# Patient Record
Sex: Female | Born: 1990 | Hispanic: Yes | Marital: Single | Smoking: Former smoker
Health system: Southern US, Community
[De-identification: ages and names within clinical notes are randomized; demographics above are authoritative.]

## PROBLEM LIST (undated history)

## (undated) ENCOUNTER — Inpatient Hospital Stay (HOSPITAL_COMMUNITY): Payer: Self-pay

## (undated) DIAGNOSIS — D649 Anemia, unspecified: Secondary | ICD-10-CM

## (undated) DIAGNOSIS — O139 Gestational [pregnancy-induced] hypertension without significant proteinuria, unspecified trimester: Secondary | ICD-10-CM

## (undated) DIAGNOSIS — R011 Cardiac murmur, unspecified: Secondary | ICD-10-CM

## (undated) HISTORY — PX: TENDON REPAIR: SHX5111

## (undated) HISTORY — PX: NO PAST SURGERIES: SHX2092

---
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
## 2012-12-06 NOTE — L&D Delivery Note (Signed)
Delivery Note At 4:36 PM a viable and healthy female was delivered via  (Presentation: LOA? ).  APGAR: pending ; weight pending.   Placenta status: intact, spontaneous.  Cord: 3 vessel with the following complications: none  Cord pH: n/a  Anesthesia:  local Episiotomy: n/a Lacerations: several second degree vaginal lacerations Suture Repair: 3-0 & 4-0 vicryl Est. Blood Loss (mL): 450cc  Mom to postpartum.  Baby to nursery-stable.  Patient progressed to complete dilation and after a brief second stage of labor, delivered a healthy female with a good spontaneous cry. Infant pinked up quickly. Repair of laceration was performed by Dr. Ike Bene.   Levert Feinstein 08/05/2013, 5:28 PM

## 2012-12-06 NOTE — L&D Delivery Note (Signed)
I was present for the entire delivery and aided with repair. Tawana Scale, MD OB Fellow 08/06/2013 7:50 AM

## 2013-01-05 ENCOUNTER — Emergency Department (HOSPITAL_COMMUNITY)
Admission: EM | Admit: 2013-01-05 | Discharge: 2013-01-05 | Disposition: A | Discharge status: Home / Self Care | Payer: Self-pay | Attending: Emergency Medicine | Admitting: Emergency Medicine

## 2013-01-05 ENCOUNTER — Encounter (HOSPITAL_COMMUNITY): Payer: Self-pay | Admitting: *Deleted

## 2013-01-05 DIAGNOSIS — B349 Viral infection, unspecified: Secondary | ICD-10-CM

## 2013-01-05 DIAGNOSIS — M545 Low back pain, unspecified: Secondary | ICD-10-CM | POA: Insufficient documentation

## 2013-01-05 DIAGNOSIS — O98519 Other viral diseases complicating pregnancy, unspecified trimester: Secondary | ICD-10-CM | POA: Insufficient documentation

## 2013-01-05 DIAGNOSIS — R112 Nausea with vomiting, unspecified: Secondary | ICD-10-CM

## 2013-01-05 DIAGNOSIS — B9789 Other viral agents as the cause of diseases classified elsewhere: Secondary | ICD-10-CM | POA: Insufficient documentation

## 2013-01-05 DIAGNOSIS — O9989 Other specified diseases and conditions complicating pregnancy, childbirth and the puerperium: Secondary | ICD-10-CM | POA: Insufficient documentation

## 2013-01-05 DIAGNOSIS — R197 Diarrhea, unspecified: Secondary | ICD-10-CM | POA: Insufficient documentation

## 2013-01-05 LAB — COMPREHENSIVE METABOLIC PANEL
ALT: 34 U/L (ref 0–35)
Albumin: 4.3 g/dL (ref 3.5–5.2)
Alkaline Phosphatase: 76 U/L (ref 39–117)
Calcium: 9.9 mg/dL (ref 8.4–10.5)
Potassium: 3.8 mEq/L (ref 3.5–5.1)
Sodium: 136 mEq/L (ref 135–145)
Total Protein: 8.2 g/dL (ref 6.0–8.3)

## 2013-01-05 LAB — URINALYSIS, ROUTINE W REFLEX MICROSCOPIC
Glucose, UA: NEGATIVE mg/dL
Leukocytes, UA: NEGATIVE
Nitrite: NEGATIVE
Protein, ur: 30 mg/dL — AB
Urobilinogen, UA: 0.2 mg/dL (ref 0.0–1.0)

## 2013-01-05 LAB — CBC WITH DIFFERENTIAL/PLATELET
Basophils Absolute: 0 10*3/uL (ref 0.0–0.1)
Eosinophils Absolute: 0 10*3/uL (ref 0.0–0.7)
Hemoglobin: 11.5 g/dL — ABNORMAL LOW (ref 12.0–15.0)
Lymphocytes Relative: 5 % — ABNORMAL LOW (ref 12–46)
MCHC: 32.4 g/dL (ref 30.0–36.0)
Neutrophils Relative %: 92 % — ABNORMAL HIGH (ref 43–77)
Platelets: 354 10*3/uL (ref 150–400)
RDW: 20.2 % — ABNORMAL HIGH (ref 11.5–15.5)

## 2013-01-05 LAB — URINE MICROSCOPIC-ADD ON

## 2013-01-05 LAB — PREGNANCY, URINE: Preg Test, Ur: POSITIVE — AB

## 2013-01-05 MED ORDER — ONDANSETRON HCL 4 MG/2ML IJ SOLN
4.0000 mg | Freq: Once | INTRAMUSCULAR | Status: AC
  Administered 2013-01-05: 4 mg via INTRAVENOUS
  Filled 2013-01-05: qty 2

## 2013-01-05 MED ORDER — ONDANSETRON 8 MG PO TBDP: ORAL_TABLET | ORAL | Status: DC

## 2013-01-05 MED ORDER — SODIUM CHLORIDE 0.9 % IV BOLUS (SEPSIS)
1000.0000 mL | Freq: Once | INTRAVENOUS | Status: AC
  Administered 2013-01-05: 1000 mL via INTRAVENOUS

## 2013-01-05 MED ORDER — ACETAMINOPHEN 325 MG PO TABS
650.0000 mg | ORAL_TABLET | Freq: Once | ORAL | Status: AC
  Administered 2013-01-05: 650 mg via ORAL
  Filled 2013-01-05: qty 2

## 2013-01-05 NOTE — ED Notes (Signed)
PA at bedside.

## 2013-01-05 NOTE — ED Notes (Signed)
The pt is c/o of lower back pain with nausea vomiting for 2 days.  lmp nov 27th.  She is 8 weeks preg

## 2013-01-05 NOTE — ED Provider Notes (Signed)
History     CSN: [id number]  Arrival date & time 01/05/13  1929   First MD Initiated Contact with Patient 01/05/13 2031      Chief Complaint  Patient presents with  . Back Pain   HPI  History provided by the patient and family. Patient is a 22 year old female currently about [redacted] weeks pregnant who presents with symptoms of nausea, vomiting, diarrhea and low back ache. Symptoms began 2 days ago with some intermittent nausea vomiting. Today patient reports having more than 5 episodes of watery loose diarrhea stool without blood or mucus. She also developed some shaking in her lower back. She denies any aggravating or alleviating factors. She has not used any treatments. She denies any associated fever, chills or sweats. Denies any urinary changes or complaints. Denies any vaginal bleeding or discharge. Patient's partner was also recently feeling ill with diarrhea symptoms lasted one day. The patient's mother is also began to feel symptoms of vomiting. She denies any other known sick contacts. Denies any recent travel. Patient is an OB/GYN provider.    History reviewed. No pertinent past medical history.  History reviewed. No pertinent past surgical history.  No family history on file.  History  Substance Use Topics  . Smoking status: Never Smoker   . Smokeless tobacco: Not on file  . Alcohol Use: No    OB History    Grav Para Term Preterm Abortions TAB SAB Ect Mult Living                  Review of Systems  Constitutional: Positive for appetite change. Negative for fever and chills.  Respiratory: Negative for shortness of breath.   Cardiovascular: Negative for chest pain.  Gastrointestinal: Positive for nausea, vomiting and diarrhea. Negative for abdominal pain, constipation, blood in stool and rectal pain.  Genitourinary: Negative for dysuria, frequency, hematuria, flank pain, vaginal bleeding, vaginal discharge, vaginal pain and pelvic pain.  Musculoskeletal: Positive for  back pain.  All other systems reviewed and are negative.    Allergies  Review of patient's allergies indicates no known allergies.  Home Medications   Current Outpatient Rx  Name  Route  Sig  Dispense  Refill  . PRENATAL PLUS 27-1 MG PO TABS   Oral   Take 1 tablet by mouth daily.           BP 124/80  Pulse 98  Temp 98.4 F (36.9 C)  Resp 16  SpO2 100%  LMP 11/01/2012  Physical Exam  Nursing note and vitals reviewed. Constitutional: She is oriented to person, place, and time. She appears well-developed and well-nourished. No distress.  HENT:  Head: Normocephalic.  Neck: Normal range of motion. Neck supple.       No meningeal signs  Cardiovascular: Normal rate and regular rhythm.   Pulmonary/Chest: Effort normal and breath sounds normal. No respiratory distress. She has no wheezes. She has no rales.  Abdominal: Soft. She exhibits no distension. There is no tenderness. There is no rebound and no guarding.       No CVA tenderness  Musculoskeletal:       Lumbar back: Normal.  Neurological: She is alert and oriented to person, place, and time.  Skin: Skin is warm and dry. No rash noted.  Psychiatric: She has a normal mood and affect. Her behavior is normal.    ED Course  Procedures   Results for orders placed during the hospital encounter of 01/05/13  URINALYSIS, ROUTINE W REFLEX MICROSCOPIC  Component Value Range   Color, Urine YELLOW  YELLOW   APPearance CLOUDY (*) CLEAR   Specific Gravity, Urine 1.031 (*) 1.005 - 1.030   pH 5.5  5.0 - 8.0   Glucose, UA NEGATIVE  NEGATIVE mg/dL   Hgb urine dipstick NEGATIVE  NEGATIVE   Bilirubin Urine NEGATIVE  NEGATIVE   Ketones, ur 40 (*) NEGATIVE mg/dL   Protein, ur 30 (*) NEGATIVE mg/dL   Urobilinogen, UA 0.2  0.0 - 1.0 mg/dL   Nitrite NEGATIVE  NEGATIVE   Leukocytes, UA NEGATIVE  NEGATIVE  PREGNANCY, URINE      Component Value Range   Preg Test, Ur POSITIVE (*) NEGATIVE  CBC WITH DIFFERENTIAL      Component  Value Range   WBC 10.5  4.0 - 10.5 K/uL   RBC 5.31 (*) 3.87 - 5.11 MIL/uL   Hemoglobin 11.5 (*) 12.0 - 15.0 g/dL   HCT 16.1 (*) 09.6 - 04.5 %   MCV 66.9 (*) 78.0 - 100.0 fL   MCH 21.7 (*) 26.0 - 34.0 pg   MCHC 32.4  30.0 - 36.0 g/dL   RDW 40.9 (*) 81.1 - 91.4 %   Platelets 354  150 - 400 K/uL   Neutrophils Relative 92 (*) 43 - 77 %   Lymphocytes Relative 5 (*) 12 - 46 %   Monocytes Relative 3  3 - 12 %   Eosinophils Relative 0  0 - 5 %   Basophils Relative 0  0 - 1 %   Neutro Abs 9.7 (*) 1.7 - 7.7 K/uL   Lymphs Abs 0.5 (*) 0.7 - 4.0 K/uL   Monocytes Absolute 0.3  0.1 - 1.0 K/uL   Eosinophils Absolute 0.0  0.0 - 0.7 K/uL   Basophils Absolute 0.0  0.0 - 0.1 K/uL   RBC Morphology ELLIPTOCYTES    COMPREHENSIVE METABOLIC PANEL      Component Value Range   Sodium 136  135 - 145 mEq/L   Potassium 3.8  3.5 - 5.1 mEq/L   Chloride 101  96 - 112 mEq/L   CO2 21  19 - 32 mEq/L   Glucose, Bld 102 (*) 70 - 99 mg/dL   BUN 9  6 - 23 mg/dL   Creatinine, Ser 7.82  0.50 - 1.10 mg/dL   Calcium 9.9  8.4 - 95.6 mg/dL   Total Protein 8.2  6.0 - 8.3 g/dL   Albumin 4.3  3.5 - 5.2 g/dL   AST 18  0 - 37 U/L   ALT 34  0 - 35 U/L   Alkaline Phosphatase 76  39 - 117 U/L   Total Bilirubin 0.5  0.3 - 1.2 mg/dL   GFR calc non Af Amer >90  >90 mL/min   GFR calc Af Amer >90  >90 mL/min  URINE MICROSCOPIC-ADD ON      Component Value Range   Squamous Epithelial / LPF MANY (*) RARE   WBC, UA 3-6  <3 WBC/hpf   Bacteria, UA MANY (*) RARE   Urine-Other MUCOUS PRESENT           1. Nausea vomiting and diarrhea   2. Viral syndrome       MDM  9:45PM patient seen and evaluated. Patient resting comfortably appears in no acute distress. Patient without any abdominal pain, vaginal bleeding or vaginal discharge.  Patient has symptoms of nausea, vomiting and diarrhea. Her partner and other family members also develop symptoms of diarrhea and vomiting recently. This time suspect viral infection  is likely  cause. Patient feeling improved after IV fluids and Zofran. We'll plan to continue prescription of Zofran for nausea vomiting symptoms. Patient will followup with OB/GYN on Monday. Patient given strict return precautions.        Angus Seller, [place] 01/06/13 [phone]

## 2013-01-06 NOTE — ED Provider Notes (Signed)
Medical screening examination/treatment/procedure(s) were performed by non-physician practitioner and as supervising physician I was immediately available for consultation/collaboration.  Usman Millett J. Kelsee Preslar, MD 01/06/13 1755 

## 2013-01-07 LAB — URINE CULTURE: Colony Count: >=100000

## 2013-03-22 ENCOUNTER — Other Ambulatory Visit: Payer: No Typology Code available for payment source

## 2013-03-22 DIAGNOSIS — Z331 Pregnant state, incidental: Secondary | ICD-10-CM

## 2013-03-22 NOTE — Progress Notes (Signed)
OB labs drawn, OB urine culture sent [facility], MLS

## 2013-03-23 ENCOUNTER — Other Ambulatory Visit: Payer: Self-pay

## 2013-03-23 LAB — OBSTETRIC PANEL
Basophils Absolute: 0 10*3/uL (ref 0.0–0.1)
HCT: 32.3 % — ABNORMAL LOW (ref 36.0–46.0)
Hepatitis B Surface Ag: NEGATIVE
Lymphocytes Relative: 23 % (ref 12–46)
Neutro Abs: 5.5 10*3/uL (ref 1.7–7.7)
Neutrophils Relative %: 71 % (ref 43–77)
Platelets: 278 10*3/uL (ref 150–400)
RDW: 19.5 % — ABNORMAL HIGH (ref 11.5–15.5)
Rubella: 1.32 Index — ABNORMAL HIGH (ref <0.90)
WBC: 7.7 10*3/uL (ref 4.0–10.5)

## 2013-03-24 LAB — CULTURE, OB URINE
Colony Count: NO GROWTH
Organism ID, Bacteria: NO GROWTH

## 2013-03-30 ENCOUNTER — Other Ambulatory Visit (HOSPITAL_COMMUNITY)
Admission: RE | Admit: 2013-03-30 | Discharge: 2013-03-30 | Disposition: A | Discharge status: Home / Self Care | Payer: No Typology Code available for payment source | Source: Ambulatory Visit | Attending: Family Medicine | Admitting: Family Medicine

## 2013-03-30 ENCOUNTER — Telehealth: Payer: Self-pay | Admitting: Family Medicine

## 2013-03-30 ENCOUNTER — Encounter: Payer: Self-pay | Admitting: Family Medicine

## 2013-03-30 ENCOUNTER — Ambulatory Visit (INDEPENDENT_AMBULATORY_CARE_PROVIDER_SITE_OTHER): Payer: No Typology Code available for payment source | Admitting: Family Medicine

## 2013-03-30 VITALS — BP 120/84 | Temp 98.3°F | Wt 143.3 lb

## 2013-03-30 DIAGNOSIS — Z3402 Encounter for supervision of normal first pregnancy, second trimester: Secondary | ICD-10-CM

## 2013-03-30 DIAGNOSIS — N760 Acute vaginitis: Secondary | ICD-10-CM

## 2013-03-30 DIAGNOSIS — O0930 Supervision of pregnancy with insufficient antenatal care, unspecified trimester: Secondary | ICD-10-CM | POA: Insufficient documentation

## 2013-03-30 DIAGNOSIS — Z113 Encounter for screening for infections with a predominantly sexual mode of transmission: Secondary | ICD-10-CM | POA: Insufficient documentation

## 2013-03-30 DIAGNOSIS — Z01419 Encounter for gynecological examination (general) (routine) without abnormal findings: Secondary | ICD-10-CM | POA: Insufficient documentation

## 2013-03-30 DIAGNOSIS — Z3400 Encounter for supervision of normal first pregnancy, unspecified trimester: Secondary | ICD-10-CM | POA: Insufficient documentation

## 2013-03-30 LAB — POCT WET PREP (WET MOUNT)

## 2013-03-30 NOTE — Telephone Encounter (Signed)
Wet prep shows possible BV- called pt x2 to let her know and explain about treatment. Once I talk to her I will send in an rx for flagyl to treat since she is symptomatic and pregnant. Left VM for her to call back but told her the clinic will be closed over the weekend so she can call Monday.   When she returns the call please get a number at which I can reach her to explain what BV is and about the medicine. Thanks!

## 2013-03-30 NOTE — Progress Notes (Signed)
Ariel Turner is a 22 y.o. G1P0 at [redacted]w[redacted]d who presents for her initial prenatal visit.  This is a planned pregnancy. She is in a relationship with the baby's father, Ariel Turner. She reports some symptoms of fatigue, frequent urination, and morning sickness. Also endorses some shortness of breath with walking or talking, but says it does not really trouble her. She is taking a prenatal vitamin. Has not yet had an anatomy ultrasound as this is her first prenatal visit. She did go and get a private ultrasound to find out baby's gender - she is having a girl. Has not yet decided on a name. Plans to have her baby come to the [facility] for pediatric care.  See flow sheet for additional details.  PMH, POBH, FH, meds, allergies and Social Hx reviewed.  Social hx: lives with her mother, works at [employer] In relationship with FOB. Used to smoke but quit when found out pregnant just after New Years.  Prenatal exam:Gen: Well nourished, well developed.  No distress.  Vitals noted. HEENT: Normocephalic, atraumatic.  Neck supple without cervical lymphadenopathy, thyromegaly or thyroid nodules.  good dentition. CV: RRR no murmur, gallops or rubs Lungs: CTA B.  Normal respiratory effort without wheezes or rales. Abd: soft, NTND. +BS.  Uterus slightly above the umbilicus. GU: Normal external female genitalia without lesions.  Nl vaginal, well rugated without lesions. No vaginal discharge.  Bimanual exam: No adnexal mass or TTP. No CMT.  Uterus size 24cm Ext: No appreciable lower extremity edema. Psych: Normal grooming and dress.  Not depressed or anxious appearing.  Normal thought content and process without flight of ideas or looseness of associations  Assessment/plan: 1) Pregnancy [redacted]w[redacted]d doing well. Late to prenatal care.  -Current pregnancy issues include limited financial support. Discussed need for anatomy scan with patient. She is participating in the adopt-a-mom program and thus her copay for  an ultrasound at Dr. Elsie Stain office is $300, which she states she is not able to pay right now. Stressed to her the importance of getting this ultrasound, and that the earlier the better. She will call us when she is able to pay and we will schedule it for her. -Pregnancy Home Risk Screening form completed today - only risk factors identified are prior smoking (quit after finding out pregnant) -PHQ-9 completed - score of 3 (not difficult at all) -Dating is reliable. -Prenatal labs reviewed, notable for Hgb 10.5. -Instructed patient to continue taking prenatal vitamins. -Gave handout on second trimester of pregnancy. -Genetic screening not offered as patient is too late.  -Early glucola is not indicated.  -Patient reports pre-gravid weight as being 170lb, and that she has lost significant weight due to early pregnancy nausea and vomiting in her first trimester. She says 1.5 months ago her weight was 144 lb. It is 143.3 today. Will continue to monitor for appropriate weight gain. -Endorses some white discharge, wet prep obtained today along with pap smear and gc/chlamydia. -Shortness of breath likely from physiologic/anatomy changes of pregnancy. Lungs clear and WOB normal on exam today. Counseled patient to return to clinic or go to ER if it becomes a bigger problem or she has chest pain.  Follow up 4 weeks in [facility] clinic with Dr. Mauricio Po. Will need one hour GTT at that visit.

## 2013-03-30 NOTE — Patient Instructions (Signed)
It was nice to meet you today!  I strongly recommend you get an ultrasound to look at the baby's anatomy. Please call us when you are able to pay the $300 so that we can schedule it.  Your next visit will be in 4 weeks in the [facility] clinic with Dr. Mauricio Po here at the [facility].  Take Care, Dr. Pollie Meyer  Pregnancy - Second Trimester The second trimester of pregnancy (3 to 6 months) is a period of rapid growth for you and your baby. At the end of the sixth month, your baby is about 9 inches long and weighs 1 1/2 pounds. You will begin to feel the baby move between 18 and 20 weeks of the pregnancy. This is called quickening. Weight gain is faster. A clear fluid (colostrum) may leak out of your breasts. You may feel small contractions of the womb (uterus). This is known as false labor or Braxton-Hicks contractions. This is like a practice for labor when the baby is ready to be born. Usually, the problems with morning sickness have usually passed by the end of your first trimester. Some women develop small dark blotches (called cholasma, mask of pregnancy) on their face that usually goes away after the baby is born. Exposure to the sun makes the blotches worse. Acne may also develop in some pregnant women and pregnant women who have acne, may find that it goes away. PRENATAL EXAMS  Blood work may continue to be done during prenatal exams. These tests are done to check on your health and the probable health of your baby. Blood work is used to follow your blood levels (hemoglobin). Anemia (low hemoglobin) is common during pregnancy. Iron and vitamins are given to help prevent this. You will also be checked for diabetes between 24 and 28 weeks of the pregnancy. Some of the previous blood tests may be repeated.  The size of the uterus is measured during each visit. This is to make sure that the baby is continuing to grow properly according to the dates of the pregnancy.  Your blood pressure is  checked every prenatal visit. This is to make sure you are not getting toxemia.  Your urine is checked to make sure you do not have an infection, diabetes or protein in the urine.  Your weight is checked often to make sure gains are happening at the suggested rate. This is to ensure that both you and your baby are growing normally.  Sometimes, an ultrasound is performed to confirm the proper growth and development of the baby. This is a test which bounces harmless sound waves off the baby so your caregiver can more accurately determine due dates. Sometimes, a specialized test is done on the amniotic fluid surrounding the baby. This test is called an amniocentesis. The amniotic fluid is obtained by sticking a needle into the belly (abdomen). This is done to check the chromosomes in instances where there is a concern about possible genetic problems with the baby. It is also sometimes done near the end of pregnancy if an early delivery is required. In this case, it is done to help make sure the baby's lungs are mature enough for the baby to live outside of the womb. CHANGES OCCURING IN THE SECOND TRIMESTER OF PREGNANCY Your body goes through many changes during pregnancy. They vary from person to person. Talk to your caregiver about changes you notice that you are concerned about.  During the second trimester, you will likely have an increase in  your appetite. It is normal to have cravings for certain foods. This varies from person to person and pregnancy to pregnancy.  Your lower abdomen will begin to bulge.  You may have to urinate more often because the uterus and baby are pressing on your bladder. It is also common to get more bladder infections during pregnancy (pain with urination). You can help this by drinking lots of fluids and emptying your bladder before and after intercourse.  You may begin to get stretch marks on your hips, abdomen, and breasts. These are normal changes in the body during  pregnancy. There are no exercises or medications to take that prevent this change.  You may begin to develop swollen and bulging veins (varicose veins) in your legs. Wearing support hose, elevating your feet for 15 minutes, 3 to 4 times a day and limiting salt in your diet helps lessen the problem.  Heartburn may develop as the uterus grows and pushes up against the stomach. Antacids recommended by your caregiver helps with this problem. Also, eating smaller meals 4 to 5 times a day helps.  Constipation can be treated with a stool softener or adding bulk to your diet. Drinking lots of fluids, vegetables, fruits, and whole grains are helpful.  Exercising is also helpful. If you have been very active up until your pregnancy, most of these activities can be continued during your pregnancy. If you have been less active, it is helpful to start an exercise program such as walking.  Hemorrhoids (varicose veins in the rectum) may develop at the end of the second trimester. Warm sitz baths and hemorrhoid cream recommended by your caregiver helps hemorrhoid problems.  Backaches may develop during this time of your pregnancy. Avoid heavy lifting, wear low heal shoes and practice good posture to help with backache problems.  Some pregnant women develop tingling and numbness of their hand and fingers because of swelling and tightening of ligaments in the wrist (carpel tunnel syndrome). This goes away after the baby is born.  As your breasts enlarge, you may have to get a bigger bra. Get a comfortable, cotton, support bra. Do not get a nursing bra until the last month of the pregnancy if you will be nursing the baby.  You may get a dark line from your belly button to the pubic area called the linea nigra.  You may develop rosy cheeks because of increase blood flow to the face.  You may develop spider looking lines of the face, neck, arms and chest. These go away after the baby is born. HOME CARE  INSTRUCTIONS   It is extremely important to avoid all smoking, herbs, alcohol, and unprescribed drugs during your pregnancy. These chemicals affect the formation and growth of the baby. Avoid these chemicals throughout the pregnancy to ensure the delivery of a healthy infant.  Most of your home care instructions are the same as suggested for the first trimester of your pregnancy. Keep your caregiver's appointments. Follow your caregiver's instructions regarding medication use, exercise and diet.  During pregnancy, you are providing food for you and your baby. Continue to eat regular, well-balanced meals. Choose foods such as meat, fish, milk and other low fat dairy products, vegetables, fruits, and whole-grain breads and cereals. Your caregiver will tell you of the ideal weight gain.  A physical sexual relationship may be continued up until near the end of pregnancy if there are no other problems. Problems could include early (premature) leaking of amniotic fluid from the membranes, vaginal  bleeding, abdominal pain, or other medical or pregnancy problems.  Exercise regularly if there are no restrictions. Check with your caregiver if you are unsure of the safety of some of your exercises. The greatest weight gain will occur in the last 2 trimesters of pregnancy. Exercise will help you:  Control your weight.  Get you in shape for labor and delivery.  Lose weight after you have the baby.  Wear a good support or jogging bra for breast tenderness during pregnancy. This may help if worn during sleep. Pads or tissues may be used in the bra if you are leaking colostrum.  Do not use hot tubs, steam rooms or saunas throughout the pregnancy.  Wear your seat belt at all times when driving. This protects you and your baby if you are in an accident.  Avoid raw meat, uncooked cheese, cat litter boxes and soil used by cats. These carry germs that can cause birth defects in the baby.  The second trimester  is also a good time to visit your dentist for your dental health if this has not been done yet. Getting your teeth cleaned is OK. Use a soft toothbrush. Brush gently during pregnancy.  It is easier to loose urine during pregnancy. Tightening up and strengthening the pelvic muscles will help with this problem. Practice stopping your urination while you are going to the bathroom. These are the same muscles you need to strengthen. It is also the muscles you would use as if you were trying to stop from passing gas. You can practice tightening these muscles up 10 times a set and repeating this about 3 times per day. Once you know what muscles to tighten up, do not perform these exercises during urination. It is more likely to contribute to an infection by backing up the urine.  Ask for help if you have financial, counseling or nutritional needs during pregnancy. Your caregiver will be able to offer counseling for these needs as well as refer you for other special needs.  Your skin may become oily. If so, wash your face with mild soap, use non-greasy moisturizer and oil or cream based makeup. MEDICATIONS AND DRUG USE IN PREGNANCY  Take prenatal vitamins as directed. The vitamin should contain 1 milligram of folic acid. Keep all vitamins out of reach of children. Only a couple vitamins or tablets containing iron may be fatal to a baby or young child when ingested.  Avoid use of all medications, including herbs, over-the-counter medications, not prescribed or suggested by your caregiver. Only take over-the-counter or prescription medicines for pain, discomfort, or fever as directed by your caregiver. Do not use aspirin.  Let your caregiver also know about herbs you may be using.  Alcohol is related to a number of birth defects. This includes fetal alcohol syndrome. All alcohol, in any form, should be avoided completely. Smoking will cause low birth rate and premature babies.  Street or illegal drugs are  very harmful to the baby. They are absolutely forbidden. A baby born to an addicted mother will be addicted at birth. The baby will go through the same withdrawal an adult does. SEEK MEDICAL CARE IF:  You have any concerns or worries during your pregnancy. It is better to call with your questions if you feel they cannot wait, rather than worry about them. SEEK IMMEDIATE MEDICAL CARE IF:   An unexplained oral temperature above 102 F (38.9 C) develops, or as your caregiver suggests.  You have leaking of fluid from the  vagina (birth canal). If leaking membranes are suspected, take your temperature and tell your caregiver of this when you call.  There is vaginal spotting, bleeding, or passing clots. Tell your caregiver of the amount and how many pads are used. Light spotting in pregnancy is common, especially following intercourse.  You develop a bad smelling vaginal discharge with a change in the color from clear to white.  You continue to feel sick to your stomach (nauseated) and have no relief from remedies suggested. You vomit blood or coffee ground-like materials.  You lose more than 2 pounds of weight or gain more than 2 pounds of weight over 1 week, or as suggested by your caregiver.  You notice swelling of your face, hands, feet, or legs.  You get exposed to [place] measles and have never had them.  You are exposed to fifth disease or chickenpox.  You develop belly (abdominal) pain. Round ligament discomfort is a common non-cancerous (benign) cause of abdominal pain in pregnancy. Your caregiver still must evaluate you.  You develop a bad headache that does not go away.  You develop fever, diarrhea, pain with urination, or shortness of breath.  You develop visual problems, blurry, or double vision.  You fall or are in a car accident or any kind of trauma.  There is mental or physical violence at home. Document Released: 11/16/2001 Document Revised: 02/14/2012 Document Reviewed:  05/21/2009 [facility] Patient Information 2013 [place], [place].

## 2013-04-04 ENCOUNTER — Telehealth: Payer: Self-pay | Admitting: Family Medicine

## 2013-04-04 DIAGNOSIS — IMO0002 Reserved for concepts with insufficient information to code with codable children: Secondary | ICD-10-CM | POA: Insufficient documentation

## 2013-04-04 DIAGNOSIS — R87612 Low grade squamous intraepithelial lesion on cytologic smear of cervix (LGSIL): Secondary | ICD-10-CM

## 2013-04-04 DIAGNOSIS — Z3402 Encounter for supervision of normal first pregnancy, second trimester: Secondary | ICD-10-CM

## 2013-04-04 MED ORDER — METRONIDAZOLE 500 MG PO TABS: 500.0000 mg | ORAL_TABLET | Freq: Two times a day (BID) | ORAL | Status: DC

## 2013-04-04 NOTE — Telephone Encounter (Signed)
Patient told me during our phone call earlier that she is ready to schedule her anatomy scan at Dr. Elsie Stain office. She is an adopt-a-mom patient and previously did not have the $300 to pay for it but now has it. [facility] red team - can we get this set up? Thank you!

## 2013-04-04 NOTE — Telephone Encounter (Signed)
Called patient back, no answer. Left voicemail asking her to return the call again. When she calls back please page me again at [phone] and I will attempt to call her back quickly.

## 2013-04-04 NOTE — Telephone Encounter (Signed)
Late entry - patient returned my call again and I called patient back today around 2:30p and explained BV and the treatment. Sent in rx. Also informed her about her pap smear results, which showed LSIL. She will need repeat pap in one year. Advised her to come to [facility] at [facility] if she has any cramping/contractions, bleeding, or fluid leaking. Pt understood these instructions.

## 2013-04-04 NOTE — Telephone Encounter (Signed)
Pt called back and message was given - would like more explanation as to what BV is - pls call script to [facility]- [address]

## 2013-04-04 NOTE — Telephone Encounter (Signed)
Will route to Dr. Pollie Meyer to call pt at [phone] Tajae Maiolo, Harold Hedge, RN

## 2013-04-05 NOTE — Telephone Encounter (Signed)
Called pt and left message to call back. Please tell pt, that Dr.Marshall's office will only do OB US on Friday mornings. ($300) Ph [phone]. appt scheduled for Friday 04-13-13 at 9:30 am. Please ask pt to keep appt, or re-schedule if unable to keep it. Thank you. Lorenda Hatchet, Renato Battles

## 2013-04-06 NOTE — Telephone Encounter (Signed)
Called pt again. Informed of appt (see below) and she agreed to keep it. Fwd. To PCP for info. Lorenda Hatchet, Renato Battles

## 2013-04-27 ENCOUNTER — Encounter: Payer: No Typology Code available for payment source | Admitting: Family Medicine

## 2013-05-17 ENCOUNTER — Ambulatory Visit (INDEPENDENT_AMBULATORY_CARE_PROVIDER_SITE_OTHER): Payer: No Typology Code available for payment source | Admitting: Family Medicine

## 2013-05-17 ENCOUNTER — Encounter: Payer: Self-pay | Admitting: Family Medicine

## 2013-05-17 VITALS — BP 132/85 | Wt 162.3 lb

## 2013-05-17 DIAGNOSIS — Z3400 Encounter for supervision of normal first pregnancy, unspecified trimester: Secondary | ICD-10-CM

## 2013-05-17 DIAGNOSIS — Z3402 Encounter for supervision of normal first pregnancy, second trimester: Secondary | ICD-10-CM

## 2013-05-17 LAB — CBC
Hemoglobin: 9.6 g/dL — ABNORMAL LOW (ref 12.0–15.0)
MCH: 23.8 pg — ABNORMAL LOW (ref 26.0–34.0)
MCV: 73 fL — ABNORMAL LOW (ref 78.0–100.0)
RBC: 4.03 MIL/uL (ref 3.87–5.11)
WBC: 8.9 10*3/uL (ref 4.0–10.5)

## 2013-05-17 NOTE — Progress Notes (Addendum)
Subjective: Ariel Turner is a 22 y.o. G1P0 at [redacted]w[redacted]d who presents for routine follow up.  She reports good fetal movement. Denies vaginal bleeding or fluid leaking. Has had some white discharge. Was treated for BV at her last visit, and reports improvement in odor since taking flagyl. Her last visit was 2 months ago and I had asked her to be seen in [facility] clinic one month later, but she mistakenly just scheduled this visit today with me for 2 months later. Her husband presents with her today. He is excited about her pregnancy and has been supportive. Pt is getting pregnancy advice from her mother, grandmother, and her husband's mother. She feels like she is well supported and getting good advice.  Patient does endorse some intermittent left lower quadrant cramping-type pain. It happens for just a few minutes at a time and then goes away. She does not have any cramping or pain right now.  Her only question/concern today is about the veins in her hands. Says these veins sometimes turn very blue. She does not have associated pain in her hands.  Infant feeding choice: breast feeding only Contraception choice: unsure yet, will think about it Infant circumcision desired: not applicable Pediatrician choice: will likely have baby come to [facility] Taking prenatal vitamins: yes Smoking currently: no  Objective: See flow sheet for details. BP initially elevated to 132/85, but came down to 121/79 on recheck with pulse of 82.  Gen: NAD, pleasant, cooperative Resp: NWOB Abd: gravid but otherwise soft, nontender to palpation Ext: no appreciable lower extremity edema bilaterally. Hands appear normal in color. Neuro: grossly nonfocal, speech intact  A/P: Pregnancy at [redacted]w[redacted]d.  Doing well.    Pregnancy issues include: - weight gain (has gained > 20lb in last month). Counseled patient on healthy eating and staying active (walking, etc) to maintain healthy weight. Discussed that she should not gain too  much more weight during this pregnancy. - LLQ cramping pain: likely just from normal pregnancy aches and pains. Counseled patient on preterm labor precautions and advised her to present to [facility] if she has any persistent pain, bleeding, LOF, or decreased fetal movement. - Occasional Increased blue color of veins: advised that this is likely not something to be concerned about as long as she doesn't have pain and can use her hands normally.  Other items: -Tdap was not given today - patient reports she had it administered at a [facility] practice last March after she had a cut. -1 hour glucola, CBC, RPR, and HIV were done today.   -Pregnancy medical home forms were done today and reviewed.  No risk factors identified except for smoking early on (quit after found out pregnant, still abstaining from smoking). -PHQ-9 done, score of 3 total. Question 9 (SI) was answered "not at all". Difficulty marked as "not difficult at all" -RH status was reviewed and pt does not need Rhogam.  Rhogam was not given today.  -Childbirth and education classes were not offered. Will need to consider this at future visit. -Preterm labor precautions reviewed. -Kick counts reviewed, handout given. -Had originally asked patient to return in one month for OB clinic, however after reviewing prenatal timeline she should actually start having visits q2 weeks now that she has reached 28 weeks. Will call her and let her know that I'd like her to be seen in OB clinic on 6/26, which will be two weeks after this appointment.  Precepted this visit with Dr. Deirdre Priest.

## 2013-05-17 NOTE — Patient Instructions (Addendum)
It was great to see you again today!  Everything seems to be going well.  If you have any bleeding, fluid leaking, cramping/contraction pain, or aren't feeling baby move like normal, go to [facility] to be evaluated.  Try to walk and stay active for exercise, to limit the amount of weight you will gain during the pregnancy. This will be healthiest for you and for baby!  Come back in one month to the [facility] clinic here at the [facility]. You can always call if you have any questions or concerns.  Be well, Dr. Pollie Meyer   Pregnancy - Second Trimester The second trimester of pregnancy (3 to 6 months) is a period of rapid growth for you and your baby. At the end of the sixth month, your baby is about 9 inches long and weighs 1 1/2 pounds. You will begin to feel the baby move between 18 and 20 weeks of the pregnancy. This is called quickening. Weight gain is faster. A clear fluid (colostrum) may leak out of your breasts. You may feel small contractions of the womb (uterus). This is known as false labor or Braxton-Hicks contractions. This is like a practice for labor when the baby is ready to be born. Usually, the problems with morning sickness have usually passed by the end of your first trimester. Some women develop small dark blotches (called cholasma, mask of pregnancy) on their face that usually goes away after the baby is born. Exposure to the sun makes the blotches worse. Acne may also develop in some pregnant women and pregnant women who have acne, may find that it goes away. PRENATAL EXAMS  Blood work may continue to be done during prenatal exams. These tests are done to check on your health and the probable health of your baby. Blood work is used to follow your blood levels (hemoglobin). Anemia (low hemoglobin) is common during pregnancy. Iron and vitamins are given to help prevent this. You will also be checked for diabetes between 24 and 28 weeks of the pregnancy. Some of the  previous blood tests may be repeated.  The size of the uterus is measured during each visit. This is to make sure that the baby is continuing to grow properly according to the dates of the pregnancy.  Your blood pressure is checked every prenatal visit. This is to make sure you are not getting toxemia.  Your urine is checked to make sure you do not have an infection, diabetes or protein in the urine.  Your weight is checked often to make sure gains are happening at the suggested rate. This is to ensure that both you and your baby are growing normally.  Sometimes, an ultrasound is performed to confirm the proper growth and development of the baby. This is a test which bounces harmless sound waves off the baby so your caregiver can more accurately determine due dates. Sometimes, a test is done on the amniotic fluid surrounding the baby. This test is called an amniocentesis. The amniotic fluid is obtained by sticking a needle into the belly (abdomen). This is done to check the chromosomes in instances where there is a concern about possible genetic problems with the baby. It is also sometimes done near the end of pregnancy if an early delivery is required. In this case, it is done to help make sure the baby's lungs are mature enough for the baby to live outside of the womb. CHANGES OCCURING IN THE SECOND TRIMESTER OF PREGNANCY Your body goes  through many changes during pregnancy. They vary from person to person. Talk to your caregiver about changes you notice that you are concerned about.  During the second trimester, you will likely have an increase in your appetite. It is normal to have cravings for certain foods. This varies from person to person and pregnancy to pregnancy.  Your lower abdomen will begin to bulge.  You may have to urinate more often because the uterus and baby are pressing on your bladder. It is also common to get more bladder infections during pregnancy. You can help this by  drinking lots of fluids and emptying your bladder before and after intercourse.  You may begin to get stretch marks on your hips, abdomen, and breasts. These are normal changes in the body during pregnancy. There are no exercises or medicines to take that prevent this change.  You may begin to develop swollen and bulging veins (varicose veins) in your legs. Wearing support hose, elevating your feet for 15 minutes, 3 to 4 times a day and limiting salt in your diet helps lessen the problem.  Heartburn may develop as the uterus grows and pushes up against the stomach. Antacids recommended by your caregiver helps with this problem. Also, eating smaller meals 4 to 5 times a day helps.  Constipation can be treated with a stool softener or adding bulk to your diet. Drinking lots of fluids, and eating vegetables, fruits, and whole grains are helpful.  Exercising is also helpful. If you have been very active up until your pregnancy, most of these activities can be continued during your pregnancy. If you have been less active, it is helpful to start an exercise program such as walking.  Hemorrhoids may develop at the end of the second trimester. Warm sitz baths and hemorrhoid cream recommended by your caregiver helps hemorrhoid problems.  Backaches may develop during this time of your pregnancy. Avoid heavy lifting, wear low heal shoes, and practice good posture to help with backache problems.  Some pregnant women develop tingling and numbness of their hand and fingers because of swelling and tightening of ligaments in the wrist (carpel tunnel syndrome). This goes away after the baby is born.  As your breasts enlarge, you may have to get a bigger bra. Get a comfortable, cotton, support bra. Do not get a nursing bra until the last month of the pregnancy if you will be nursing the baby.  You may get a dark line from your belly button to the pubic area called the linea nigra.  You may develop rosy cheeks  because of increase blood flow to the face.  You may develop spider looking lines of the face, neck, arms, and chest. These go away after the baby is born. HOME CARE INSTRUCTIONS   It is extremely important to avoid all smoking, herbs, alcohol, and unprescribed drugs during your pregnancy. These chemicals affect the formation and growth of the baby. Avoid these chemicals throughout the pregnancy to ensure the delivery of a healthy infant.  Most of your home care instructions are the same as suggested for the first trimester of your pregnancy. Keep your caregiver's appointments. Follow your caregiver's instructions regarding medicine use, exercise, and diet.  During pregnancy, you are providing food for you and your baby. Continue to eat regular, well-balanced meals. Choose foods such as meat, fish, milk and other low fat dairy products, vegetables, fruits, and whole-grain breads and cereals. Your caregiver will tell you of the ideal weight gain.  A physical sexual  relationship may be continued up until near the end of pregnancy if there are no other problems. Problems could include early (premature) leaking of amniotic fluid from the membranes, vaginal bleeding, abdominal pain, or other medical or pregnancy problems.  Exercise regularly if there are no restrictions. Check with your caregiver if you are unsure of the safety of some of your exercises. The greatest weight gain will occur in the last 2 trimesters of pregnancy. Exercise will help you:  Control your weight.  Get you in shape for labor and delivery.  Lose weight after you have the baby.  Wear a good support or jogging bra for breast tenderness during pregnancy. This may help if worn during sleep. Pads or tissues may be used in the bra if you are leaking colostrum.  Do not use hot tubs, steam rooms or saunas throughout the pregnancy.  Wear your seat belt at all times when driving. This protects you and your baby if you are in an  accident.  Avoid raw meat, uncooked cheese, cat litter boxes, and soil used by cats. These carry germs that can cause birth defects in the baby.  The second trimester is also a good time to visit your dentist for your dental health if this has not been done yet. Getting your teeth cleaned is okay. Use a soft toothbrush. Brush gently during pregnancy.  It is easier to leak urine during pregnancy. Tightening up and strengthening the pelvic muscles will help with this problem. Practice stopping your urination while you are going to the bathroom. These are the same muscles you need to strengthen. It is also the muscles you would use as if you were trying to stop from passing gas. You can practice tightening these muscles up 10 times a set and repeating this about 3 times per day. Once you know what muscles to tighten up, do not perform these exercises during urination. It is more likely to contribute to an infection by backing up the urine.  Ask for help if you have financial, counseling, or nutritional needs during pregnancy. Your caregiver will be able to offer counseling for these needs as well as refer you for other special needs.  Your skin may become oily. If so, wash your face with mild soap, use non-greasy moisturizer and oil or cream based makeup. MEDICINES AND DRUG USE IN PREGNANCY  Take prenatal vitamins as directed. The vitamin should contain 1 milligram of folic acid. Keep all vitamins out of reach of children. Only a couple vitamins or tablets containing iron may be fatal to a baby or young child when ingested.  Avoid use of all medicines, including herbs, over-the-counter medicines, not prescribed or suggested by your caregiver. Only take over-the-counter or prescription medicines for pain, discomfort, or fever as directed by your caregiver. Do not use aspirin.  Let your caregiver also know about herbs you may be using.  Alcohol is related to a number of birth defects. This includes  fetal alcohol syndrome. All alcohol, in any form, should be avoided completely. Smoking will cause low birth rate and premature babies.  Street or illegal drugs are very harmful to the baby. They are absolutely forbidden. A baby born to an addicted mother will be addicted at birth. The baby will go through the same withdrawal an adult does. SEEK MEDICAL CARE IF:  You have any concerns or worries during your pregnancy. It is better to call with your questions if you feel they cannot wait, rather than worry about them.  SEEK IMMEDIATE MEDICAL CARE IF:   An unexplained oral temperature above 102 F (38.9 C) develops, or as your caregiver suggests.  You have leaking of fluid from the vagina (birth canal). If leaking membranes are suspected, take your temperature and tell your caregiver of this when you call.  There is vaginal spotting, bleeding, or passing clots. Tell your caregiver of the amount and how many pads are used. Light spotting in pregnancy is common, especially following intercourse.  You develop a bad smelling vaginal discharge with a change in the color from clear to white.  You continue to feel sick to your stomach (nauseated) and have no relief from remedies suggested. You vomit blood or coffee ground-like materials.  You lose more than 2 pounds of weight or gain more than 2 pounds of weight over 1 week, or as suggested by your caregiver.  You notice swelling of your face, hands, feet, or legs.  You get exposed to [place] measles and have never had them.  You are exposed to fifth disease or chickenpox.  You develop belly (abdominal) pain. Round ligament discomfort is a common non-cancerous (benign) cause of abdominal pain in pregnancy. Your caregiver still must evaluate you.  You develop a bad headache that does not go away.  You develop fever, diarrhea, pain with urination, or shortness of breath.  You develop visual problems, blurry, or double vision.  You fall or are in  a car accident or any kind of trauma.  There is mental or physical violence at home. Document Released: 11/16/2001 Document Revised: 08/16/2012 Document Reviewed: 05/21/2009 [facility] Patient Information 2014 [place], [place].    Fetal Movement Counts Patient Name: __________________________________________________ Patient Due Date: ____________________ Performing a fetal movement count is highly recommended in high-risk pregnancies, but it is good for every pregnant woman to do. Your caregiver may ask you to start counting fetal movements at 28 weeks of the pregnancy. Fetal movements often increase:  After eating a full meal.  After physical activity.  After eating or drinking something sweet or cold.  At rest. Pay attention to when you feel the baby is most active. This will help you notice a pattern of your baby's sleep and wake cycles and what factors contribute to an increase in fetal movement. It is important to perform a fetal movement count at the same time each day when your baby is normally most active.  HOW TO COUNT FETAL MOVEMENTS 1. Find a quiet and comfortable area to sit or lie down on your left side. Lying on your left side provides the best blood and oxygen circulation to your baby. 2. Write down the day and time on a sheet of paper or in a journal. 3. Start counting kicks, flutters, swishes, rolls, or jabs in a 2 hour period. You should feel at least 10 movements within 2 hours. 4. If you do not feel 10 movements in 2 hours, wait 2 3 hours and count again. Look for a change in the pattern or not enough counts in 2 hours. SEEK MEDICAL CARE IF:  You feel less than 10 counts in 2 hours, tried twice.  There is no movement in over an hour.  The pattern is changing or taking longer each day to reach 10 counts in 2 hours.  You feel the baby is not moving as he or she usually does. Date: ____________ Movements: ____________ Start time: ____________ Doreatha Martin time: ____________    Date: ____________ Movements: ____________ Start time: ____________ Doreatha Martin time: ____________ Date: ____________  Movements: ____________ Start time: ____________ Doreatha Martin time: ____________ Date: ____________ Movements: ____________ Start time: ____________ Doreatha Martin time: ____________ Date: ____________ Movements: ____________ Start time: ____________ Doreatha Martin time: ____________ Date: ____________ Movements: ____________ Start time: ____________ Doreatha Martin time: ____________ Date: ____________ Movements: ____________ Start time: ____________ Doreatha Martin time: ____________ Date: ____________ Movements: ____________ Start time: ____________ Doreatha Martin time: ____________  Date: ____________ Movements: ____________ Start time: ____________ Doreatha Martin time: ____________ Date: ____________ Movements: ____________ Start time: ____________ Doreatha Martin time: ____________ Date: ____________ Movements: ____________ Start time: ____________ Doreatha Martin time: ____________ Date: ____________ Movements: ____________ Start time: ____________ Doreatha Martin time: ____________ Date: ____________ Movements: ____________ Start time: ____________ Doreatha Martin time: ____________ Date: ____________ Movements: ____________ Start time: ____________ Doreatha Martin time: ____________ Date: ____________ Movements: ____________ Start time: ____________ Doreatha Martin time: ____________  Date: ____________ Movements: ____________ Start time: ____________ Doreatha Martin time: ____________ Date: ____________ Movements: ____________ Start time: ____________ Doreatha Martin time: ____________ Date: ____________ Movements: ____________ Start time: ____________ Doreatha Martin time: ____________ Date: ____________ Movements: ____________ Start time: ____________ Doreatha Martin time: ____________ Date: ____________ Movements: ____________ Start time: ____________ Doreatha Martin time: ____________ Date: ____________ Movements: ____________ Start time: ____________ Doreatha Martin time: ____________ Date: ____________ Movements: ____________  Start time: ____________ Doreatha Martin time: ____________  Date: ____________ Movements: ____________ Start time: ____________ Doreatha Martin time: ____________ Date: ____________ Movements: ____________ Start time: ____________ Doreatha Martin time: ____________ Date: ____________ Movements: ____________ Start time: ____________ Doreatha Martin time: ____________ Date: ____________ Movements: ____________ Start time: ____________ Doreatha Martin time: ____________ Date: ____________ Movements: ____________ Start time: ____________ Doreatha Martin time: ____________ Date: ____________ Movements: ____________ Start time: ____________ Doreatha Martin time: ____________ Date: ____________ Movements: ____________ Start time: ____________ Doreatha Martin time: ____________  Date: ____________ Movements: ____________ Start time: ____________ Doreatha Martin time: ____________ Date: ____________ Movements: ____________ Start time: ____________ Doreatha Martin time: ____________ Date: ____________ Movements: ____________ Start time: ____________ Doreatha Martin time: ____________ Date: ____________ Movements: ____________ Start time: ____________ Doreatha Martin time: ____________ Date: ____________ Movements: ____________ Start time: ____________ Doreatha Martin time: ____________ Date: ____________ Movements: ____________ Start time: ____________ Doreatha Martin time: ____________ Date: ____________ Movements: ____________ Start time: ____________ Doreatha Martin time: ____________  Date: ____________ Movements: ____________ Start time: ____________ Doreatha Martin time: ____________ Date: ____________ Movements: ____________ Start time: ____________ Doreatha Martin time: ____________ Date: ____________ Movements: ____________ Start time: ____________ Doreatha Martin time: ____________ Date: ____________ Movements: ____________ Start time: ____________ Doreatha Martin time: ____________ Date: ____________ Movements: ____________ Start time: ____________ Doreatha Martin time: ____________ Date: ____________ Movements: ____________ Start time: ____________ Doreatha Martin time:  ____________ Date: ____________ Movements: ____________ Start time: ____________ Doreatha Martin time: ____________  Date: ____________ Movements: ____________ Start time: ____________ Doreatha Martin time: ____________ Date: ____________ Movements: ____________ Start time: ____________ Doreatha Martin time: ____________ Date: ____________ Movements: ____________ Start time: ____________ Doreatha Martin time: ____________ Date: ____________ Movements: ____________ Start time: ____________ Doreatha Martin time: ____________ Date: ____________ Movements: ____________ Start time: ____________ Doreatha Martin time: ____________ Date: ____________ Movements: ____________ Start time: ____________ Doreatha Martin time: ____________ Date: ____________ Movements: ____________ Start time: ____________ Doreatha Martin time: ____________  Date: ____________ Movements: ____________ Start time: ____________ Doreatha Martin time: ____________ Date: ____________ Movements: ____________ Start time: ____________ Doreatha Martin time: ____________ Date: ____________ Movements: ____________ Start time: ____________ Doreatha Martin time: ____________ Date: ____________ Movements: ____________ Start time: ____________ Doreatha Martin time: ____________ Date: ____________ Movements: ____________ Start time: ____________ Doreatha Martin time: ____________ Date: ____________ Movements: ____________ Start time: ____________ Doreatha Martin time: ____________ Document Released: 12/22/2006 Document Revised: 11/08/2012 Document Reviewed: 09/18/2012 [employer] Patient Information 2014 [employer].

## 2013-05-18 LAB — HIV ANTIBODY (ROUTINE TESTING W REFLEX): HIV: NONREACTIVE

## 2013-05-28 ENCOUNTER — Telehealth: Payer: Self-pay | Admitting: Family Medicine

## 2013-05-28 NOTE — Telephone Encounter (Signed)
Attempted to call pt to discuss follow up appointment. I had asked her to be seen in one month, not realizing that she actually needs to be seen in 2 weeks from her prior appointment. That puts her needing an appointment at the end of this week. I would like for her to be seen this Thursday AM in [facility] clinic if possible, or if that does not work she should see another provider Wed, Thurs, or Fri of this week for a routine f/u prenatal visit.  I also am planning to start her on iron BID, but this can be discussed at her next prenatal visit.  I am happy to talk to her on the phone when she calls back. Please page me at [phone] and I will return her call ASAP.  Levert Feinstein, MD

## 2013-05-29 NOTE — Telephone Encounter (Signed)
Patient called back and was given the message below.  Dr. Pollie Meyer was in clinic at the time, but when I called back to the nurses station, no one answered so I moved forward with scheduling the patient for the [facility] this Thursday.  We also left her scheduled for the OB clinic 2 weeks from this Thursday as well.  She did ask for Dr. Pollie Meyer to call her back as she wanted to make sure everything is ok.  I let her know that it sounds like at her stage in her pregnancy, it was time to schedule her every 2 weeks and that was all Dr. Pollie Meyer was trying to do.

## 2013-05-31 ENCOUNTER — Ambulatory Visit (INDEPENDENT_AMBULATORY_CARE_PROVIDER_SITE_OTHER): Payer: No Typology Code available for payment source | Admitting: Family Medicine

## 2013-05-31 VITALS — BP 110/80 | Wt 164.0 lb

## 2013-05-31 DIAGNOSIS — Z3400 Encounter for supervision of normal first pregnancy, unspecified trimester: Secondary | ICD-10-CM

## 2013-05-31 DIAGNOSIS — O99013 Anemia complicating pregnancy, third trimester: Secondary | ICD-10-CM

## 2013-05-31 DIAGNOSIS — O99019 Anemia complicating pregnancy, unspecified trimester: Secondary | ICD-10-CM

## 2013-05-31 DIAGNOSIS — Z3402 Encounter for supervision of normal first pregnancy, second trimester: Secondary | ICD-10-CM

## 2013-05-31 DIAGNOSIS — Z23 Encounter for immunization: Secondary | ICD-10-CM

## 2013-05-31 MED ORDER — DOCUSATE SODIUM 100 MG PO CAPS: 100.0000 mg | ORAL_CAPSULE | Freq: Two times a day (BID) | ORAL | Status: DC

## 2013-05-31 MED ORDER — FERROUS SULFATE 325 (65 FE) MG PO TABS: 325.0000 mg | ORAL_TABLET | Freq: Every day | ORAL | Status: DC

## 2013-05-31 NOTE — Addendum Note (Signed)
Addended by: Jennette Bill on: 05/31/2013 11:03 AM   Modules accepted: Orders

## 2013-05-31 NOTE — Patient Instructions (Addendum)
It was a pleasure to see you today. You are at 30 weeks and 1 day along in your pregnancy.  We are giving you the tetanus vaccine today, to protect you and your baby until her first tetanus vaccine at 17 months of age.  I sent a prescription for ferrous sulfate 325mg  (iron), take 1 tablet twice daily.  I also sent docusate (Colace) 100mg , as a stool softener. You may take 1 capsule twice daily as needed for constipation.   Your next follow up visit with Dr Pollie Meyer is in 2 weeks.  SCHEDULE PRENATAL VISIT WITH DR Pollie Meyer IN 2 WEEKS, AND ANOTHER IN 4 WEEKS (2 WEEKS LATER).  Preventing Preterm Labor Preterm labor is when a pregnant woman has contractions that cause the cervix to open, shorten, and thin before 37 weeks of pregnancy. You will have regular contractions (tightening) 2 to 3 minutes apart. This usually causes discomfort or pain. HOME CARE  Eat a healthy diet.  Take your vitamins as told by your doctor.  Drink enough fluids to keep your pee (urine) clear or pale yellow every day.  Get rest and sleep.  Do not have sex if you are at high risk for preterm labor.  Follow your doctor's advice about activity, medicines, and tests.  Avoid stress.  Avoid hard labor or exercise that lasts for a long time.  Do not smoke. GET HELP RIGHT AWAY IF:   You are having contractions.  You have belly (abdominal) pain.  You have bleeding from your vagina.  You have pain when you pee (urinate).  You have abnormal discharge from your vagina.  You have a temperature by mouth above 102 F (38.9 C). MAKE SURE YOU:  Understand these instructions.  Will watch your condition.  Will get help if you are not doing well or get worse. Document Released: 02/18/2009 Document Revised: 02/14/2012 Document Reviewed: 02/18/2009 [facility] Patient Information 2014 [place], [place].

## 2013-05-31 NOTE — Progress Notes (Signed)
Patient seen today in [facility]; she is 30 weeks 1day along in her pregnancy.  No concerns or problems. Feels daily fetal movement. Plans to breastfeed her daughter. Married. Patient's mother is present for today's visit.  Discussed results of 28-week labs, including mild anemia, recommendation for FeSO4 325mg , two tabs daily.  Also Colace as needed for constipation. Discussed maintenance of hydration during summer months to prevent preterm contractions.  She does not have contractions nor vaginal discharge/bleeding now.  No dysuria.  PMH risk tool and PHQ9 completed today; both negative (PHQ9 score 1 for Q#3 about sleep). Plan for Tdap today, discussed passive immunity for baby.  Follow up with Dr Pollie Meyer in 2 weeks for routine prenatal care. Paula Compton, MD

## 2013-06-14 ENCOUNTER — Ambulatory Visit (INDEPENDENT_AMBULATORY_CARE_PROVIDER_SITE_OTHER): Payer: No Typology Code available for payment source | Admitting: Family Medicine

## 2013-06-14 ENCOUNTER — Other Ambulatory Visit: Payer: Self-pay

## 2013-06-14 ENCOUNTER — Encounter: Payer: Self-pay | Admitting: Family Medicine

## 2013-06-14 ENCOUNTER — Ambulatory Visit (HOSPITAL_COMMUNITY)
Admission: RE | Admit: 2013-06-14 | Discharge: 2013-06-14 | Disposition: A | Discharge status: Home / Self Care | Payer: No Typology Code available for payment source | Source: Ambulatory Visit | Attending: Family Medicine | Admitting: Family Medicine

## 2013-06-14 VITALS — BP 118/77 | Temp 98.0°F | Wt 173.0 lb

## 2013-06-14 DIAGNOSIS — Z3403 Encounter for supervision of normal first pregnancy, third trimester: Secondary | ICD-10-CM

## 2013-06-14 DIAGNOSIS — R0609 Other forms of dyspnea: Secondary | ICD-10-CM

## 2013-06-14 DIAGNOSIS — R0989 Other specified symptoms and signs involving the circulatory and respiratory systems: Secondary | ICD-10-CM | POA: Insufficient documentation

## 2013-06-14 DIAGNOSIS — R0600 Dyspnea, unspecified: Secondary | ICD-10-CM

## 2013-06-14 DIAGNOSIS — Z3400 Encounter for supervision of normal first pregnancy, unspecified trimester: Secondary | ICD-10-CM

## 2013-06-14 NOTE — Progress Notes (Signed)
Pt seen in OB clinic with mother present at [redacted]w[redacted]d per LMP. She feels daily fetal movement, reports white d/c that has been unchanged since previous visits, and denies any burning on urination, abnormal uterine bleeding, or contractions. Fundal height and HR normal for GA. Pt still plans to breastfeed daughter.   She is taking 1 tab of FeSO4/day and is tolerating it well w/o use of docusate or other stool softener. Talked to pt about repeating CBC during next visit in two weeks to ensure that her anemia has resolved (Hgb >10)  Pt did complain of some chest pressure that she has been experiencing for a couple months now and occasional SOB. Lung sounds were clear and equal bilaterally on auscultation and CV exam showed no edema, RRR, normal S1 and S2 with 2/6 systolic flow murmur. An ECG was performed to screen for cardiac causes of SOB and establish baseline. ECG normal sinus rhythm, w/o axis deviation or other wave abnormalities. Pt was counseled on the physiologic changes of pregnancy and red flag symptoms to watch out for should she experience them.

## 2013-06-14 NOTE — Progress Notes (Signed)
Attending Note Patient seen and examined by me in [facility] along with Donnal Moat, [facility], she is 22yoG1P0 @[redacted]w[redacted]d  EGA.  She reports feeling well overall; feels daily FM, no vaginal bleeding/discharge/LOF.  Does report some mild dyspnea at rest and with mild exertion that has been ongoing for the past 1 month.  No chest pain, no cough, no leg/calf pain or tenderness/swelling.  Is tolerating the FeSO4 1 tablet daily, not using stool softener, no constipation. Exam: well appearing, no apparent distress.  No increased work of breathing or tachypnea. Neck supple.  COR Regular S1S2 with soft II/VI SEM.  PULM Clear bilaterally, without wheezes or rales. EXTS: no calf tenderness or disparate calf girth.  Assess/Plan: 1) microcytic anemia (last Hgb 9.6% 2 weeks ago), is tolerating FeSO4.  Would recheck H/H in another 2 weeks (approx 1 month after she initiated the FeSO4). Given the longstanding nature of her microcytic anemia, to see what workup has been done (none in Results Review tab on Epic).  Discussed physiologic dyspnea of pregnancy, which I believe is the cause of her complaint of dyspnea.  Discussed changes that should prompt immediate evaluation.  Her ECG done today is unremarkable, to serve as baseline.  MOther reports today that Chevelle had an 'arhythmia' as a young child which resolved and has not been followed since.  For routine prenatal follow up in 2 weeks with H/H at that time.  Paula Compton, MD

## 2013-06-14 NOTE — Assessment & Plan Note (Signed)
See Vitals and Notes. JB

## 2013-06-14 NOTE — Patient Instructions (Addendum)
It was a pleasure to see you in our [facility] today; you are at 32 weeks and 1 day in your pregnancy.  Continue taking the iron tablets, one per day, until your next visit in 2 weeks.  We will check the blood count again at your next visit.  Your ECG is normal today.  Please let us know if you have any changes or worsening in your shortness of breath, or if you experience chest pain, cough, or leg swelling/pain.  Follow up visit in 2 weeks with Dr. Pollie Meyer.  Preventing Preterm Labor Preterm labor is when a pregnant woman has contractions that cause the cervix to open, shorten, and thin before 37 weeks of pregnancy. You will have regular contractions (tightening) 2 to 3 minutes apart. This usually causes discomfort or pain. HOME CARE  Eat a healthy diet.  Take your vitamins as told by your doctor.  Drink enough fluids to keep your pee (urine) clear or pale yellow every day.  Get rest and sleep.  Do not have sex if you are at high risk for preterm labor.  Follow your doctor's advice about activity, medicines, and tests.  Avoid stress.  Avoid hard labor or exercise that lasts for a long time.  Do not smoke. GET HELP RIGHT AWAY IF:   You are having contractions.  You have belly (abdominal) pain.  You have bleeding from your vagina.  You have pain when you pee (urinate).  You have abnormal discharge from your vagina.  You have a temperature by mouth above 102 F (38.9 C). MAKE SURE YOU:  Understand these instructions.  Will watch your condition.  Will get help if you are not doing well or get worse. Document Released: 02/18/2009 Document Revised: 02/14/2012 Document Reviewed: 02/18/2009 [employer] Patient Information 2014 [place], [place].

## 2013-06-15 ENCOUNTER — Telehealth: Payer: Self-pay | Admitting: Family Medicine

## 2013-06-15 NOTE — Telephone Encounter (Signed)
Returning call to pt regarding back pain. Recommended Tylenol and heating pad on affected area (upper back) - call for appointment if not better by Monday . Pt verbalized understanding. Wyatt Haste, RN-BSN

## 2013-06-15 NOTE — Telephone Encounter (Signed)
Pt is having upper back pains since last night. She would like to know if she should come in or what she can get OTC to make this feel better. JW

## 2013-06-28 ENCOUNTER — Ambulatory Visit (INDEPENDENT_AMBULATORY_CARE_PROVIDER_SITE_OTHER): Payer: No Typology Code available for payment source | Admitting: Family Medicine

## 2013-06-28 VITALS — BP 126/85 | Wt 175.0 lb

## 2013-06-28 DIAGNOSIS — O99019 Anemia complicating pregnancy, unspecified trimester: Secondary | ICD-10-CM

## 2013-06-28 DIAGNOSIS — Z3400 Encounter for supervision of normal first pregnancy, unspecified trimester: Secondary | ICD-10-CM

## 2013-06-28 DIAGNOSIS — O99013 Anemia complicating pregnancy, third trimester: Secondary | ICD-10-CM

## 2013-06-28 DIAGNOSIS — Z3403 Encounter for supervision of normal first pregnancy, third trimester: Secondary | ICD-10-CM

## 2013-06-28 LAB — CBC
HCT: 33.2 % — ABNORMAL LOW (ref 36.0–46.0)
Hemoglobin: 10.8 g/dL — ABNORMAL LOW (ref 12.0–15.0)
MCH: 25.6 pg — ABNORMAL LOW (ref 26.0–34.0)
MCHC: 32.5 g/dL (ref 30.0–36.0)
RBC: 4.22 MIL/uL (ref 3.87–5.11)

## 2013-06-28 NOTE — Patient Instructions (Signed)
It was great to see you again today!  Your next appointment is in 2 weeks. Schedule this on the way out. We're repeating your blood test today to see if your blood counts have come up. I'll call you if we need to change your iron dose.  If the skin tag starts to bother you, come back and we can look at it closer.  If you have any cramping, regular contractions, bleeding, fluid leaking, or feel like Emma isn't moving well, go to [facility] right away to be evaluated.  You can call us if you have any questions.   Be well, Dr. Pollie Meyer  Pregnancy - Third Trimester The third trimester of pregnancy (the last 3 months) is a period of the most rapid growth for you and your baby. The baby approaches a length of 20 inches and a weight of 6 to 10 pounds. The baby is adding on fat and getting ready for life outside your body. While inside, babies have periods of sleeping and waking, sucking thumbs, and hiccuping. You can often feel small contractions of the uterus. This is false labor. It is also called Braxton-Hicks contractions. This is like a practice for labor. The usual problems in this stage of pregnancy include more difficulty breathing, swelling of the hands and feet from water retention, and having to urinate more often because of the uterus and baby pressing on your bladder.  PRENATAL EXAMS  Blood work may continue to be done during prenatal exams. These tests are done to check on your health and the probable health of your baby. Blood work is used to follow your blood levels (hemoglobin). Anemia (low hemoglobin) is common during pregnancy. Iron and vitamins are given to help prevent this. You may also continue to be checked for diabetes. Some of the past blood tests may be done again.  The size of the uterus is measured during each visit. This makes sure your baby is growing properly according to your pregnancy dates.  Your blood pressure is checked every prenatal visit. This is to make  sure you are not getting toxemia.  Your urine is checked every prenatal visit for infection, diabetes, and protein.  Your weight is checked at each visit. This is done to make sure gains are happening at the suggested rate and that you and your baby are growing normally.  Sometimes, an ultrasound is performed to confirm the position and the proper growth and development of the baby. This is a test done that bounces harmless sound waves off the baby so your caregiver can more accurately determine a due date.  Discuss the type of pain medicine and anesthesia you will have during your labor and delivery.  Discuss the possibility and anesthesia if a cesarean section might be necessary.  Inform your caregiver if there is any mental or physical violence at home. Sometimes, a specialized non-stress test, contraction stress test, and biophysical profile are done to make sure the baby is not having a problem. Checking the amniotic fluid surrounding the baby is called an amniocentesis. The amniotic fluid is removed by sticking a needle into the belly (abdomen). This is sometimes done near the end of pregnancy if an early delivery is required. In this case, it is done to help make sure the baby's lungs are mature enough for the baby to live outside of the womb. If the lungs are not mature and it is unsafe to deliver the baby, an injection of cortisone medicine is given to the  mother 1 to 2 days before the delivery. This helps the baby's lungs mature and makes it safer to deliver the baby. CHANGES OCCURING IN THE THIRD TRIMESTER OF PREGNANCY Your body goes through many changes during pregnancy. They vary from person to person. Talk to your caregiver about changes you notice and are concerned about.  During the last trimester, you have probably had an increase in your appetite. It is normal to have cravings for certain foods. This varies from person to person and pregnancy to pregnancy.  You may begin to get  stretch marks on your hips, abdomen, and breasts. These are normal changes in the body during pregnancy. There are no exercises or medicines to take which prevent this change.  Constipation may be treated with a stool softener or adding bulk to your diet. Drinking lots of fluids, fiber in vegetables, fruits, and whole grains are helpful.  Exercising is also helpful. If you have been very active up until your pregnancy, most of these activities can be continued during your pregnancy. If you have been less active, it is helpful to start an exercise program such as walking. Consult your caregiver before starting exercise programs.  Avoid all smoking, alcohol, non-prescribed drugs, herbs and "street drugs" during your pregnancy. These chemicals affect the formation and growth of the baby. Avoid chemicals throughout the pregnancy to ensure the delivery of a healthy infant.  Backache, varicose veins, and hemorrhoids may develop or get worse.  You will tire more easily in the third trimester, which is normal.  The baby's movements may be stronger and more often.  You may become short of breath easily.  Your belly button may stick out.  A yellow discharge may leak from your breasts called colostrum.  You may have a bloody mucus discharge. This usually occurs a few days to a week before labor begins. HOME CARE INSTRUCTIONS   Keep your caregiver's appointments. Follow your caregiver's instructions regarding medicine use, exercise, and diet.  During pregnancy, you are providing food for you and your baby. Continue to eat regular, well-balanced meals. Choose foods such as meat, fish, milk and other low fat dairy products, vegetables, fruits, and whole-grain breads and cereals. Your caregiver will tell you of the ideal weight gain.  A physical sexual relationship may be continued throughout pregnancy if there are no other problems such as early (premature) leaking of amniotic fluid from the membranes,  vaginal bleeding, or belly (abdominal) pain.  Exercise regularly if there are no restrictions. Check with your caregiver if you are unsure of the safety of your exercises. Greater weight gain will occur in the last 2 trimesters of pregnancy. Exercising helps:  Control your weight.  Get you in shape for labor and delivery.  You lose weight after you deliver.  Rest a lot with legs elevated, or as needed for leg cramps or low back pain.  Wear a good support or jogging bra for breast tenderness during pregnancy. This may help if worn during sleep. Pads or tissues may be used in the bra if you are leaking colostrum.  Do not use hot tubs, steam rooms, or saunas.  Wear your seat belt when driving. This protects you and your baby if you are in an accident.  Avoid raw meat, cat litter boxes and soil used by cats. These carry germs that can cause birth defects in the baby.  It is easier to leak urine during pregnancy. Tightening up and strengthening the pelvic muscles will help with this problem.  You can practice stopping your urination while you are going to the bathroom. These are the same muscles you need to strengthen. It is also the muscles you would use if you were trying to stop from passing gas. You can practice tightening these muscles up 10 times a set and repeating this about 3 times per day. Once you know what muscles to tighten up, do not perform these exercises during urination. It is more likely to cause an infection by backing up the urine.  Ask for help if you have financial, counseling, or nutritional needs during pregnancy. Your caregiver will be able to offer counseling for these needs as well as refer you for other special needs.  Make a list of emergency phone numbers and have them available.  Plan on getting help from family or friends when you go home from the hospital.  Make a trial run to the hospital.  Take prenatal classes with the father to understand, practice, and  ask questions about the labor and delivery.  Prepare the baby's room or nursery.  Do not travel out of the city unless it is absolutely necessary and with the advice of your caregiver.  Wear only low or no heal shoes to have better balance and prevent falling. MEDICINES AND DRUG USE IN PREGNANCY  Take prenatal vitamins as directed. The vitamin should contain 1 milligram of folic acid. Keep all vitamins out of reach of children. Only a couple vitamins or tablets containing iron may be fatal to a baby or young child when ingested.  Avoid use of all medicines, including herbs, over-the-counter medicines, not prescribed or suggested by your caregiver. Only take over-the-counter or prescription medicines for pain, discomfort, or fever as directed by your caregiver. Do not use aspirin, ibuprofen or naproxen unless approved by your caregiver.  Let your caregiver also know about herbs you may be using.  Alcohol is related to a number of birth defects. This includes fetal alcohol syndrome. All alcohol, in any form, should be avoided completely. Smoking will cause low birth rate and premature babies.  Illegal drugs are very harmful to the baby. They are absolutely forbidden. A baby born to an addicted mother will be addicted at birth. The baby will go through the same withdrawal an adult does. SEEK MEDICAL CARE IF: You have any concerns or worries during your pregnancy. It is better to call with your questions if you feel they cannot wait, rather than worry about them. SEEK IMMEDIATE MEDICAL CARE IF:   An unexplained oral temperature above 102 F (38.9 C) develops, or as your caregiver suggests.  You have leaking of fluid from the vagina. If leaking membranes are suspected, take your temperature and tell your caregiver of this when you call.  There is vaginal spotting, bleeding or passing clots. Tell your caregiver of the amount and how many pads are used.  You develop a bad smelling vaginal  discharge with a change in the color from clear to white.  You develop vomiting that lasts more than 24 hours.  You develop chills or fever.  You develop shortness of breath.  You develop burning on urination.  You loose more than 2 pounds of weight or gain more than 2 pounds of weight or as suggested by your caregiver.  You notice sudden swelling of your face, hands, and feet or legs.  You develop belly (abdominal) pain. Round ligament discomfort is a common non-cancerous (benign) cause of abdominal pain in pregnancy. Your caregiver still must evaluate  you.  You develop a severe headache that does not go away.  You develop visual problems, blurred or double vision.  If you have not felt your baby move for more than 1 hour. If you think the baby is not moving as much as usual, eat something with sugar in it and lie down on your left side for an hour. The baby should move at least 4 to 5 times per hour. Call right away if your baby moves less than that.  You fall, are in a car accident, or any kind of trauma.  There is mental or physical violence at home. Document Released: 11/16/2001 Document Revised: 08/16/2012 Document Reviewed: 05/21/2009 [facility] Patient Information 2014 [place], [place].

## 2013-07-04 NOTE — Progress Notes (Signed)
Ariel Turner is a 22 y.o. G1P0 at [redacted]w[redacted]d for routine prenatal care.  She reports that she is doing well. Denies vaginal bleeding. Has had some normal white vaginal discharge. Good fetal movement. She has had two episodes of pain. The first was a few days ago and was on the right side of her abdomen. It lasted for about 20 minutes, then went away. The second was this morning and happened on her left side in a vertical line. This lasted for 1 hour and then went away. Feeling well now.  Has also noted some skin tags on her inner thighs. They are not hurting her, she has just noticed them. See flow sheet for details.  Exam:  BP 126/85  Wt 175 lb (79.379 kg)  LMP 11/01/2012  Gen: NAD Lungs: NWOB Abd: gravid, nontender with measurement of fundal height & FHR Skin: superficial skin tags noted on inner thigh. No signs of infection (no erythema or drainage, no skin breakdown). Ext: no appreciable lower extremity edema bilaterally Neuro: grossly nonfocal, speech intact     A/P: Pregnancy at [redacted]w[redacted]d.  Doing well.    Pregnancy issues include anemia - currently taking iron. Will repeat CBC today.  Infant feeding choice breast (discussed in previous visit) Contraception choice: need to discuss at next visit Infant circumcision desired not applicable Given handout for pre-registration at [facility].  Tdap already given at previous visit. GBS/GC/CZ testing was not performed today - will defer to 36 week visit.  Abdominal pain is likely related to aches/pains of pregnancy. Not concerning as it was not persistent or regular, in a contraction-type pattern. Good FHR and fetal movement per patient report. Preterm labor precautions reviewed, along with reasons to go to [facility] for evaluation (bleeding, decreased fetal movement, cramping/contractions, or fluid leaking.  Follow up 2 weeks - will perform GBS, GC/chlamydia testing at that time.

## 2013-07-12 ENCOUNTER — Other Ambulatory Visit (HOSPITAL_COMMUNITY)
Admission: RE | Admit: 2013-07-12 | Discharge: 2013-07-12 | Disposition: A | Discharge status: Home / Self Care | Payer: No Typology Code available for payment source | Source: Ambulatory Visit | Attending: Family Medicine | Admitting: Family Medicine

## 2013-07-12 ENCOUNTER — Ambulatory Visit (INDEPENDENT_AMBULATORY_CARE_PROVIDER_SITE_OTHER): Payer: No Typology Code available for payment source | Admitting: Family Medicine

## 2013-07-12 VITALS — BP 123/82 | Wt 174.0 lb

## 2013-07-12 DIAGNOSIS — Z113 Encounter for screening for infections with a predominantly sexual mode of transmission: Secondary | ICD-10-CM | POA: Insufficient documentation

## 2013-07-12 DIAGNOSIS — Z331 Pregnant state, incidental: Secondary | ICD-10-CM

## 2013-07-12 NOTE — Progress Notes (Signed)
Ariel Turner is a 22 y.o. G1P0 at [redacted]w[redacted]d for routine follow up.  She reports she has been doing well. Baby is moving well. No vaginal bleeding or fluid leaking. Did have some pain in her belly on Sunday that was present when she stood up but went away after laying down. See flow sheet for details.  A/P: Pregnancy at [redacted]w[redacted]d.  Doing well.   Pregnancy issues include anemia, had good response to iron therapy. Infant feeding choice breast per previous visit Contraception choice need to address at next visit Infant circumcision desired not applicable  Tdapwas not given today. GBS/GC/CZ testing was performed today.  Preterm labor precautions reviewed. Safe sleep discussed. Fetal movement monitoring discussed Follow up 1 week with female provider on red team for backup provider.

## 2013-07-12 NOTE — Patient Instructions (Signed)
It was great to see you again today!  If you have any cramping/contractions, vaginal bleeding, fluid leaking, or are worried that baby is not moving normally, go immediately to [facility] to be evaluated.  Schedule an appointment in one week to follow up. We'll have you see a different doctor next week so that if I am not available to deliver your baby, you can try to have a doctor you know.  Be well, Dr. Pollie Meyer

## 2013-07-14 LAB — STREP B DNA PROBE: GBSP: NEGATIVE

## 2013-07-16 ENCOUNTER — Encounter: Payer: Self-pay | Admitting: Family Medicine

## 2013-07-17 ENCOUNTER — Ambulatory Visit (INDEPENDENT_AMBULATORY_CARE_PROVIDER_SITE_OTHER): Payer: No Typology Code available for payment source | Admitting: Family Medicine

## 2013-07-17 ENCOUNTER — Encounter: Payer: No Typology Code available for payment source | Admitting: Family Medicine

## 2013-07-17 VITALS — BP 128/84 | Temp 98.3°F | Wt 177.0 lb

## 2013-07-17 DIAGNOSIS — Z3480 Encounter for supervision of other normal pregnancy, unspecified trimester: Secondary | ICD-10-CM

## 2013-07-17 DIAGNOSIS — Z3493 Encounter for supervision of normal pregnancy, unspecified, third trimester: Secondary | ICD-10-CM

## 2013-07-17 NOTE — Patient Instructions (Signed)

## 2013-07-17 NOTE — Progress Notes (Signed)
23 y/O G1P0 here for routine prenatal care,denies any concern, she feels fetal movement daily,feels like baby moves a lot,denies any uterine contraction or abdominal pain,no vaginal discharge or bleeding.She is compliant with prenatal vitamin and iron. Exam: As documented in flow sheet.            Resp/CVS: wnl.  A/P: Patient is at [redacted]w[redacted]d today.         Pregnancy progressing well.         Lab and test result reviewed and discussed with patient.         GBS,GC/Chlamymidia negative.         PAP result discussed with patient with LSIL, patient advised to discuss repeat test with PCP during post partum period.         Fetal kick count,preterm labor discussed.         Post partum care discussed. Uncertain most of birth control after delivery,she will discuss this with her PCP.         F/U in 1wk.

## 2013-07-19 ENCOUNTER — Encounter: Payer: Self-pay | Admitting: Family Medicine

## 2013-07-20 ENCOUNTER — Telehealth: Payer: Self-pay | Admitting: Family Medicine

## 2013-07-20 ENCOUNTER — Encounter (HOSPITAL_COMMUNITY): Payer: Self-pay | Admitting: *Deleted

## 2013-07-20 ENCOUNTER — Inpatient Hospital Stay (HOSPITAL_COMMUNITY)
Admission: AD | Admit: 2013-07-20 | Discharge: 2013-07-20 | Disposition: A | Discharge status: Home / Self Care | Payer: No Typology Code available for payment source | Source: Ambulatory Visit | Attending: Obstetrics & Gynecology | Admitting: Obstetrics & Gynecology

## 2013-07-20 DIAGNOSIS — N898 Other specified noninflammatory disorders of vagina: Secondary | ICD-10-CM

## 2013-07-20 DIAGNOSIS — O99891 Other specified diseases and conditions complicating pregnancy: Secondary | ICD-10-CM | POA: Insufficient documentation

## 2013-07-20 HISTORY — DX: Anemia, unspecified: D64.9

## 2013-07-20 LAB — WET PREP, GENITAL
Clue Cells Wet Prep HPF POC: NONE SEEN
Yeast Wet Prep HPF POC: NONE SEEN

## 2013-07-20 NOTE — Telephone Encounter (Signed)
Pt reports that for the past 2 or 3 days that after she urinates " there is still a small amount of wettness coming out" - recommended that pt go to [facility] for further eval. Pt verbalized understanding. Wyatt Haste, RN-BSN

## 2013-07-20 NOTE — MAU Provider Note (Signed)
  History     CSN: [id number]  Arrival date and time: 07/20/13 1229   First Provider Initiated Contact with Patient 07/20/13 1321      Chief Complaint  Patient presents with  . Rupture of Membranes   HPI Sharline Sherlon Handing is a 22 y.o. G1P0 at [redacted]w[redacted]d presents for evaluation of leakage of fluid. Patient states that she has increased discharge of fluid after she urinates. Patient states that she's not amniotic fluid at other times. OB History   Grav Para Term Preterm Abortions TAB SAB Ect Mult Living   1         0      Past Medical History  Diagnosis Date  . Anemia     Past Surgical History  Procedure Laterality Date  . No past surgeries      Family History  Problem Relation Age of Onset  . Thyroid disease Mother   . Diabetes Other   . Hypertension Other     History  Substance Use Topics  . Smoking status: Former Games developer  . Smokeless tobacco: Not on file  . Alcohol Use: No    Allergies: No Known Allergies  Prescriptions prior to admission  Medication Sig Dispense Refill  . docusate sodium (COLACE) 100 MG capsule Take 1 capsule (100 mg total) by mouth 2 (two) times daily.  30 capsule  2  . ferrous sulfate 325 (65 FE) MG tablet Take 1 tablet (325 mg total) by mouth daily with breakfast.  60 tablet  3  . prenatal vitamin w/FE, FA (PRENATAL 1 + 1) 27-1 MG TABS Take 1 tablet by mouth daily.        ROS Physical Exam   Blood pressure 135/90, pulse 97, temperature 98.7 F (37.1 C), temperature source Oral, resp. rate 16, height 5' 2.75" (1.594 m), last menstrual period 11/01/2012, SpO2 99.00%.  Physical Exam SVE: white discharge in vault. Neg for ferning. Toco: no ctx FHT: 150s mod variability, +Accels no decels.   MAU Course  Procedures  MDM eval for ferning. Neg at entroitus. White discharge in vagina, neg for ferning.   Assessment and Plan  Jenalee Sherlon Handing is a 22 y.o. G1P0 at [redacted]w[redacted]d here for eval for ROM. Pt hx more consistent with urine leakage. Pt Neg  for ferning x2 from introitus and vagina. Reassuring for urine. Pt given strict return precautions for labor or LOF that does not stop or signficant amount. Pt understands. FWB CAT I.  Zane Samson RYAN 07/20/2013, 1:27 PM

## 2013-07-20 NOTE — MAU Note (Signed)
Patient states she has been leaking clear fluid after she has already urinated since 8-12. States she constantly feels damp. Denies bleeding, mild contractions and reports good fetal movement.

## 2013-07-24 ENCOUNTER — Ambulatory Visit (INDEPENDENT_AMBULATORY_CARE_PROVIDER_SITE_OTHER): Payer: No Typology Code available for payment source | Admitting: Family Medicine

## 2013-07-24 VITALS — BP 124/83 | Temp 98.4°F | Wt 180.0 lb

## 2013-07-24 DIAGNOSIS — Z3403 Encounter for supervision of normal first pregnancy, third trimester: Secondary | ICD-10-CM

## 2013-07-24 DIAGNOSIS — Z3400 Encounter for supervision of normal first pregnancy, unspecified trimester: Secondary | ICD-10-CM

## 2013-07-24 NOTE — Progress Notes (Signed)
S: 22 yo primigravida presents with FOB for routine OB f/u. No complaints or concerns. She did go to [facility] last week with vaginal leakage. Membranes intact. Fetal head down. She admits to L groin pain that was once intermittent is now constant. She is still able to sleep and get comfortable. She plans to breast feed. FOB smokes outside. O: see flowsheet  A/P:  Patient is [redacted]w[redacted]d today. Progressing well.  Reviewed labor signs, reasons to go to [facility] and kick counts.  Discussed breastfeeding plan, congratulated and encouraged decision. Discussed back to sleep and no smoking around baby to lower risk of SIDs.  F/u in one week.

## 2013-07-24 NOTE — MAU Provider Note (Signed)
Attestation of Attending Supervision of Fellow: Evaluation and management procedures were performed by the Fellow under my supervision and collaboration.  I have reviewed the Fellow's note and chart, and I agree with the management and plan.    

## 2013-07-24 NOTE — Assessment & Plan Note (Signed)
A: progressing well P: f/u in one week

## 2013-07-24 NOTE — Patient Instructions (Addendum)
Thank you both for coming in today. Congratulations on your baby girl!  Please watch out for signs of labor: vaginal bleeding, leakage or gush of fluids, 4 or more contractions for 2 or more hours. Please call the clinic if any of these occur and go to [facility].   Please watch out for decreased fetal movement: If you feel that the baby is not moving as well as usual. You should count kicks. Go to a quite room and count each kick. Anything less than 10 kicks an hour for 2 or more hours is decreased/low. If this happens try drinking cool water. If that does not help. Call the clinic/go to [facility] to be evaluated.    F/u in one week with Dr. Pollie Meyer if she is available or someone else on her team.  Remember to rub your nipples with a dry, rough wash cloth to prepare for breastfeeding. Remember back to sleep and not cigarette smoke exposure to prevent risk of SIDs.   Dr. Armen Pickup

## 2013-07-27 ENCOUNTER — Encounter: Payer: Self-pay | Admitting: Family Medicine

## 2013-08-02 ENCOUNTER — Ambulatory Visit (INDEPENDENT_AMBULATORY_CARE_PROVIDER_SITE_OTHER): Payer: Self-pay | Admitting: Family Medicine

## 2013-08-02 VITALS — BP 143/100 | Temp 98.2°F | Wt 185.6 lb

## 2013-08-02 DIAGNOSIS — Z3400 Encounter for supervision of normal first pregnancy, unspecified trimester: Secondary | ICD-10-CM

## 2013-08-02 DIAGNOSIS — Z3403 Encounter for supervision of normal first pregnancy, third trimester: Secondary | ICD-10-CM

## 2013-08-02 LAB — CBC WITH DIFFERENTIAL/PLATELET
Eosinophils Absolute: 0 10*3/uL (ref 0.0–0.7)
Eosinophils Relative: 0 % (ref 0–5)
HCT: 36.4 % (ref 36.0–46.0)
Lymphocytes Relative: 19 % (ref 12–46)
MCH: 27.6 pg (ref 26.0–34.0)
MCHC: 34.9 g/dL (ref 30.0–36.0)
MCV: 79.1 fL (ref 78.0–100.0)
Monocytes Absolute: 0.6 10*3/uL (ref 0.1–1.0)
Platelets: 227 10*3/uL (ref 150–400)
RDW: 20.5 % — ABNORMAL HIGH (ref 11.5–15.5)
WBC: 9.9 10*3/uL (ref 4.0–10.5)

## 2013-08-02 NOTE — Patient Instructions (Addendum)
PLease come back tomorrow for a nursing visit for a blood pressure check  I dont think you have pre-eclampsia but below I have highlighted to the reasons to get to the hospital  Preeclampsia and Eclampsia Preeclampsia is a condition of high blood pressure during pregnancy. It can happen at 20 weeks or later in pregnancy. If high blood pressure occurs in the second half of pregnancy with no other symptoms, it is called gestational hypertension and goes away after the baby is born. If any of the symptoms listed below develop with gestational hypertension, it is then called preeclampsia. Eclampsia (convulsions) may follow preeclampsia. This is one of the reasons for regular prenatal checkups. Early diagnosis and treatment are very important to prevent eclampsia. CAUSES  There is no known cause of preeclampsia/eclampsia in pregnancy. There are several known conditions that may put the pregnant woman at risk, such as:  The first pregnancy.  Having preeclampsia in a past pregnancy.  Having lasting (chronic) high blood pressure.  Having multiples (twins, triplets).  Being age 71 or older.  African American ethnic background.  Having kidney disease or diabetes.  Medical conditions such as lupus or blood diseases.  Being overweight (obese). SYMPTOMS   High blood pressure.  Headaches.  Sudden weight gain.  Swelling of hands, face, legs, and feet.  Protein in the urine.  Feeling sick to your stomach (nauseous) and throwing up (vomiting).  Vision problems (blurred or double vision).  Numbness in the face, arms, legs, and feet.  Dizziness.  Slurred speech.  Preeclampsia can cause growth retardation in the fetus.  Separation (abruption) of the placenta.  Not enough fluid in the amniotic sac (oligohydramnios).  Sensitivity to bright lights.  Belly (abdominal) pain. DIAGNOSIS  If protein is found in the urine in the second half of pregnancy, this is considered preeclampsia.  Other symptoms mentioned above may also be present. TREATMENT  It is necessary to treat this.  Your caregiver may prescribe bed rest early in this condition. Plenty of rest and salt restriction may be all that is needed.  Medicines may be necessary to lower blood pressure if the condition does not respond to more conservative measures.  In more severe cases, hospitalization may be needed:  For treatment of blood pressure.  To control fluid retention.  To monitor the baby to see if the condition is causing harm to the baby.  Hospitalization is the best way to treat the first sign of preeclampsia. This is so the mother and baby can be watched closely and blood tests can be done effectively and correctly.  If the condition becomes severe, it may be necessary to induce labor or to remove the infant by surgical means (cesarean section). The best cure for preeclampsia/eclampsia is to deliver the baby. Preeclampsia and eclampsia involve risks to mother and infant. Your caregiver will discuss these risks with you. Together, you can work out the best possible approach to your problems. Make sure you keep your prenatal visits as scheduled. Not keeping appointments could result in a chronic or permanent injury, pain, disability to you, and death or injury to you or your unborn baby. If there is any problem keeping the appointment, you must call to reschedule. HOME CARE INSTRUCTIONS   Keep your prenatal appointments and tests as scheduled.  Tell your caregiver if you have any of the above risk factors.  Get plenty of rest and sleep.  Eat a balanced diet that is low in salt, and do not add salt to  your food.  Avoid stressful situations.  Only take over-the-counter and prescriptions medicines for pain, discomfort, or fever as directed by your caregiver. SEEK IMMEDIATE MEDICAL CARE IF:   You develop severe swelling anywhere in the body. This usually occurs in the legs.  You develop a severe  headache, dizziness, problems with your vision, or confusion.  You have abdominal pain, nausea, or vomiting.  You have a seizure.  You have trouble moving any part of your body, or you develop numbness or problems speaking.  You have bruising or abnormal bleeding from anywhere in the body.  You develop a stiff neck.  You pass out. MAKE SURE YOU:   Understand these instructions.  Will watch your condition.  Will get help right away if you are not doing well or get worse. Document Released: 11/19/2000 Document Revised: 02/14/2012 Document Reviewed: 07/05/2008 [facility] Patient Information 2014 [place], [place].

## 2013-08-02 NOTE — Progress Notes (Signed)
Ariel Turner is a 22 y.o. G1P0 at [redacted]w[redacted]d for routine follow up.  She reports shortness of breath for 1-2 weeks, denies headache, scotoma, RUQ pain, and edema  See flow sheet for details. Patellar reflex 2-3+, Manual BP check 132/92  A/P: Pregnancy at [redacted]w[redacted]d.  Doing well, with new HTN  Infant feeding choice breast GBS reviewed- negative contraception choice undecided  Labor precautions reviewed. Will do pre-eclampsia labs and review in the Am. She has no HA/scotoma but does have 2-3 + patellar reflex. Without symptoms I feel that she is on the mild end of pre-eclapmsia spectrum IF she is pre-eclamptic. Will follow up on labs and advise pt as necessary.

## 2013-08-02 NOTE — Progress Notes (Signed)
Additional PE from today's visit: No swelling, tenderness of BL calves, lungs CTAB, and breathing non-labored

## 2013-08-03 ENCOUNTER — Telehealth: Payer: Self-pay | Admitting: Family Medicine

## 2013-08-03 ENCOUNTER — Encounter (HOSPITAL_COMMUNITY): Payer: Self-pay

## 2013-08-03 ENCOUNTER — Ambulatory Visit: Payer: Self-pay | Admitting: *Deleted

## 2013-08-03 ENCOUNTER — Inpatient Hospital Stay (HOSPITAL_COMMUNITY)
Admission: RE | Admit: 2013-08-03 | Discharge: 2013-08-07 | DRG: 775 | Disposition: A | Discharge status: Home / Self Care | Payer: Medicaid Other | Source: Ambulatory Visit | Attending: Obstetrics & Gynecology | Admitting: Obstetrics & Gynecology

## 2013-08-03 VITALS — BP 143/94 | HR 90

## 2013-08-03 DIAGNOSIS — O139 Gestational [pregnancy-induced] hypertension without significant proteinuria, unspecified trimester: Principal | ICD-10-CM | POA: Diagnosis present

## 2013-08-03 DIAGNOSIS — I10 Essential (primary) hypertension: Secondary | ICD-10-CM

## 2013-08-03 HISTORY — DX: Cardiac murmur, unspecified: R01.1

## 2013-08-03 HISTORY — DX: Gestational (pregnancy-induced) hypertension without significant proteinuria, unspecified trimester: O13.9

## 2013-08-03 LAB — COMPREHENSIVE METABOLIC PANEL
ALT: 11 U/L (ref 0–35)
AST: 18 U/L (ref 0–37)
Alkaline Phosphatase: 184 U/L — ABNORMAL HIGH (ref 39–117)
CO2: 24 mEq/L (ref 19–32)
Creat: 0.56 mg/dL (ref 0.50–1.10)
Sodium: 135 mEq/L (ref 135–145)
Total Bilirubin: 0.4 mg/dL (ref 0.3–1.2)
Total Protein: 6.7 g/dL (ref 6.0–8.3)

## 2013-08-03 LAB — PROTEIN / CREATININE RATIO, URINE
Creatinine, Urine: 93.2 mg/dL
Total Protein, Urine: 10 mg/dL

## 2013-08-03 LAB — CBC
HCT: 36.1 % (ref 36.0–46.0)
Hemoglobin: 12.4 g/dL (ref 12.0–15.0)
MCHC: 34.3 g/dL (ref 30.0–36.0)
MCV: 80.9 fL (ref 78.0–100.0)

## 2013-08-03 MED ORDER — LIDOCAINE HCL (PF) 1 % IJ SOLN
30.0000 mL | INTRAMUSCULAR | Status: AC | PRN
  Administered 2013-08-05: 30 mL via SUBCUTANEOUS
  Filled 2013-08-03 (×2): qty 30

## 2013-08-03 MED ORDER — LACTATED RINGERS IV SOLN
INTRAVENOUS | Status: DC
  Administered 2013-08-03 – 2013-08-04 (×2): via INTRAVENOUS

## 2013-08-03 MED ORDER — IBUPROFEN 600 MG PO TABS
600.0000 mg | ORAL_TABLET | Freq: Four times a day (QID) | ORAL | Status: DC | PRN
  Administered 2013-08-05: 600 mg via ORAL
  Filled 2013-08-03: qty 1

## 2013-08-03 MED ORDER — ONDANSETRON HCL 4 MG/2ML IJ SOLN: 4.0000 mg | Freq: Four times a day (QID) | INTRAMUSCULAR | Status: DC | PRN

## 2013-08-03 MED ORDER — MISOPROSTOL 25 MCG QUARTER TABLET
25.0000 ug | ORAL_TABLET | ORAL | Status: DC | PRN
  Administered 2013-08-03: 25 ug via VAGINAL
  Filled 2013-08-03 (×2): qty 0.25

## 2013-08-03 MED ORDER — TERBUTALINE SULFATE 1 MG/ML IJ SOLN: 0.2500 mg | Freq: Once | INTRAMUSCULAR | Status: AC | PRN

## 2013-08-03 MED ORDER — LACTATED RINGERS IV SOLN: 500.0000 mL | INTRAVENOUS | Status: DC | PRN

## 2013-08-03 MED ORDER — OXYTOCIN BOLUS FROM INFUSION: 500.0000 mL | INTRAVENOUS | Status: DC

## 2013-08-03 MED ORDER — OXYTOCIN 40 UNITS IN LACTATED RINGERS INFUSION - SIMPLE MED
62.5000 mL/h | INTRAVENOUS | Status: DC
  Filled 2013-08-03: qty 1000

## 2013-08-03 MED ORDER — ACETAMINOPHEN 325 MG PO TABS: 650.0000 mg | ORAL_TABLET | ORAL | Status: DC | PRN

## 2013-08-03 MED ORDER — OXYCODONE-ACETAMINOPHEN 5-325 MG PO TABS: 1.0000 | ORAL_TABLET | ORAL | Status: DC | PRN

## 2013-08-03 MED ORDER — CITRIC ACID-SODIUM CITRATE 334-500 MG/5ML PO SOLN: 30.0000 mL | ORAL | Status: DC | PRN

## 2013-08-03 NOTE — H&P (Signed)
Ariel Turner is a 22 y.o. female presenting for induction of labor secondary to gestational hypertension. She denies feeling contractions. Baby has been moving well. No vaginal bleeding or fluid leaking.  This pregnancy has been unremarkable except for elevated blood pressures noted yesterday. She denies headache, vision changes, RUQ pain. Prenatal care at [facility].  She is planning to breastfeed and is unsure about contraception.  History OB History   Grav Para Term Preterm Abortions TAB SAB Ect Mult Living   1         0     Past Medical History  Diagnosis Date  . Anemia   . Heart murmur     during infancy  . Pregnancy induced hypertension    Past Surgical History  Procedure Laterality Date  . No past surgeries     Family History: family history includes Diabetes in her other; Hypertension in her other; Thyroid disease in her mother. Social History:  reports that she has never smoked. She has never used smokeless tobacco. She reports that she does not drink alcohol or use illicit drugs.   Prenatal Transfer Tool  Maternal Diabetes: No Genetic Screening: Declined Maternal Ultrasounds/Referrals: Normal Fetal Ultrasounds or other Referrals:  None Maternal Substance Abuse:  No Significant Maternal Medications:  None Significant Maternal Lab Results:  Lab values include: Group B Strep negative Other Comments:  None  ROS no ctx, fluid leaking, vag bleeding  Blood pressure 132/85, pulse 80, temperature 98 F (36.7 C), temperature source Oral, resp. rate 18, height 5' 2.75" (1.594 m), weight 185 lb 9.6 oz (84.188 kg), last menstrual period 11/01/2012. Exam Physical Exam  Gen: NAD Heart: RRR Lungs: CTAB, NWOB Abd: gravid but otherwise soft, nontender to palpation Ext: no appreciable lower extremity edema bilaterally Neuro: grossly nonfocal, speech intact GU: normal appearing external genitalia Dilation: Fingertip Effacement (%): 50 Cervical Position: Posterior Station:  -3 Presentation: Vertex Exam by:: Lilli Few, RN   Prenatal labs: ABO, Rh: A/POS/-- (04/17 1511) Antibody: NEG (04/17 1511) Rubella: 1.32 (04/17 1511) RPR: NON REAC (06/12 1623)  HBsAg: NEGATIVE (04/17 1511)  HIV: NON REACTIVE (06/12 1623)  GBS: NEGATIVE (08/07 1511)   Assessment/Plan: Ariel Turner is a 22 y.o. G1P0 at [redacted]w[redacted]d who presents for IOL secondary to gestational hypertension.  -Admit to L&D -Cervix currently unfavorable, will start with cytotec vaginally -Pt does not plan to get epidural -Anticipate NSVD  Levert Feinstein 08/03/2013, 10:52 PM    I have seen and examined this patient and agree with above documentation in the resident's note. Pt not having severe range pressures. PIH labs normal yesterday, will not redraw. Will not start Mg. Unfavorable cervix, start cervical ripening with cytotec. FWB cat I. Anticipate SVD  Rulon Abide, M.D. [facility] Fellow 08/04/2013 7:55 AM

## 2013-08-03 NOTE — Telephone Encounter (Signed)
NST/BPP scheduled at [facility] for 08/08/2013 at 8:30am.  Pt notified.  Kayla Weekes, Darlyne Russian, CMA

## 2013-08-03 NOTE — Telephone Encounter (Signed)
Called pt to discuss labs   Recent Labs Lab 08/02/13 1548  AST 18  ALT 11  ALKPHOS 184*  BILITOT 0.4  PROT 6.7  ALBUMIN 3.4*    Recent Labs Lab 08/02/13 1548  HGB 12.7  HCT 36.4  WBC 9.9  PLT 227   Urine Protein/Cre ratio is 0.11  I informed her that the labs do not indicate pre-eclampsia. She had a BP check this am and states that her BP was 143/90, although this is not charted yet. I discussed the warning signs of pre-eclampsia and getting checked out in the MAU if any of these develop.   Will go ahead and set an NST on her due date in 5 days.   Murtis Sink, MD [facility] Resident, PGY-2 08/03/2013, 10:03 AM

## 2013-08-03 NOTE — Progress Notes (Signed)
Blood Pressure check 143/90 with pulse of 90 - denies any signs or symptoms of HTN ( headache,eye problems, etc.) PLease advise if any changes are needed. Wyatt Haste, RN-BSN

## 2013-08-03 NOTE — Telephone Encounter (Signed)
Appreciate Dr. Felipa Emory excellent care of my patient. From reviewing her chart and the medical literature, she now meets criteria for gestational hypertension and as she is >[redacted]weeks gestation, should be induced. I have discussed this with Dr. Lum Babe of [facility], and Dr. Marice Potter of OB/GYN. I have scheduled patient for an induction appointment tonight at [facility], she will need to arrive at 7:15. Called patient to let her know. She will be there at 7:15.

## 2013-08-04 ENCOUNTER — Encounter (HOSPITAL_COMMUNITY): Payer: Self-pay

## 2013-08-04 LAB — COMPREHENSIVE METABOLIC PANEL
CO2: 22 mEq/L (ref 19–32)
Calcium: 8.7 mg/dL (ref 8.4–10.5)
Creatinine, Ser: 0.51 mg/dL (ref 0.50–1.10)
GFR calc Af Amer: >90 mL/min (ref >90)
GFR calc non Af Amer: >90 mL/min (ref >90)
Glucose, Bld: 80 mg/dL (ref 70–99)

## 2013-08-04 LAB — PROTEIN / CREATININE RATIO, URINE
Protein Creatinine Ratio: 0.15 (ref 0.00–0.15)
Total Protein, Urine: 8.9 mg/dL

## 2013-08-04 LAB — TYPE AND SCREEN: ABO/RH(D): A POS

## 2013-08-04 MED ORDER — MISOPROSTOL 25 MCG QUARTER TABLET
25.0000 ug | ORAL_TABLET | ORAL | Status: DC | PRN
  Administered 2013-08-04: 25 ug via ORAL

## 2013-08-04 MED ORDER — MISOPROSTOL 200 MCG PO TABS
50.0000 ug | ORAL_TABLET | ORAL | Status: DC | PRN
  Administered 2013-08-04: 25 ug via ORAL
  Filled 2013-08-04: qty 0.25

## 2013-08-04 NOTE — Progress Notes (Signed)
Ariel Turner is a 22 y.o. G1P0 at [redacted]w[redacted]d admitted for induction of labor due to gestational hypertension.  Subjective: Pt comfortable, family at bedside.  Pt had questions about IOL, reasons she has to be induced.  Discussed recommendations for IOL at 39 weeks with gestational HTN, reasons including increased risks to mom and baby including preeclampsia and fetal demise. Also discussed low C/S rate of this practice and plan to have vaginal delivery unless C/S becomes absolutely necessary. Pt and family state understanding and agree to proceed with induction.  Objective: BP 135/90  Pulse 81  Temp(Src) 98.3 F (36.8 C) (Oral)  Resp 20  Ht 5' 2.75" (1.594 m)  Wt 84.188 kg (185 lb 9.6 oz)  BMI 33.13 kg/m2  LMP 11/01/2012      FHT:  FHR: 135 bpm, variability: moderate,  accelerations:  Present,  decelerations:  Absent UC:   irregular, every 3-15 minutes SVE:   1/40/-3, vertex  Foley bulb placed during cervical exam.  AROM occurred during placement of Foley.  Otherwise, no difficulty with placement and pt tolerated well.  Bulb filled to 60 ml by RN.   Labs: Lab Results  Component Value Date   WBC 9.4 08/03/2013   HGB 12.4 08/03/2013   HCT 36.1 08/03/2013   MCV 80.9 08/03/2013   PLT 230 08/03/2013    Assessment / Plan: Induction of labor due to gestational hypertension S/P Cytotec Foley bulb in place  Labor: Progressing normally Preeclampsia:  no signs or symptoms of toxicity and labs stable Fetal Wellbeing:  Category I Pain Control:  Labor support without medications I/D:  n/a Anticipated MOD:  NSVD  LEFTWICH-KIRBY, Arek Spadafore 08/04/2013, 11:36 AM

## 2013-08-04 NOTE — Progress Notes (Signed)
Ariel Turner is a 22 y.o. G1P0 at [redacted]w[redacted]d admitted for induction of labor due to [facility].  Subjective: Pt comfortable, family at bedside.   Objective: BP 124/85  Pulse 78  Temp(Src) 98 F (36.7 C) (Oral)  Resp 20  Ht 5' 2.75" (1.594 m)  Wt 84.188 kg (185 lb 9.6 oz)  BMI 33.13 kg/m2  LMP 11/01/2012      FHT:  FHR: 140 bpm, variability: moderate,  accelerations:  Present,  decelerations:  Absent UC:   regular, every 2-3 minutes, mild to palpation SVE:   Foley bulb still in place, confirmed with digital exam  Labs: Lab Results  Component Value Date   WBC 9.4 08/03/2013   HGB 12.4 08/03/2013   HCT 36.1 08/03/2013   MCV 80.9 08/03/2013   PLT 230 08/03/2013    Assessment / Plan: Induction of labor due to gestational hypertension Foley bulb in place  Labor: Progressing normally Preeclampsia:  no signs or symptoms of toxicity and labs stable Fetal Wellbeing:  Category I Pain Control:  Labor support without medications I/D:  n/a Anticipated MOD:  NSVD  Levert Feinstein 08/04/2013, 9:32 PM

## 2013-08-04 NOTE — Progress Notes (Signed)
Ariel Turner is a 22 y.o. G1P0 at [redacted]w[redacted]d admitted for induction of labor due to [facility].  Subjective: Pt comfortable, family at bedside.   Objective: BP 131/87  Pulse 85  Temp(Src) 98.1 F (36.7 C) (Oral)  Resp 20  Ht 5' 2.75" (1.594 m)  Wt 84.188 kg (185 lb 9.6 oz)  BMI 33.13 kg/m2  LMP 11/01/2012      FHT:  FHR: 135 bpm, variability: moderate,  accelerations:  Present,  decelerations:  Absent UC:   regular, every 2-3 minutes, mild to palpation SVE:   Dilation: 1 Effacement (%): 50 Station: -2 Exam by:: Leftwich-Kirby CNM Foley bulb still in place, RN applied traction within last hour.  Will continue current course.  Labs: Lab Results  Component Value Date   WBC 9.4 08/03/2013   HGB 12.4 08/03/2013   HCT 36.1 08/03/2013   MCV 80.9 08/03/2013   PLT 230 08/03/2013    Assessment / Plan: Induction of labor due to gestational hypertension Foley bulb in place  Labor: Progressing normally Preeclampsia:  no signs or symptoms of toxicity and labs stable Fetal Wellbeing:  Category I Pain Control:  Labor support without medications I/D:  n/a Anticipated MOD:  NSVD  LEFTWICH-KIRBY, LISA 08/04/2013, 5:11 PM

## 2013-08-05 ENCOUNTER — Encounter (HOSPITAL_COMMUNITY): Payer: Self-pay

## 2013-08-05 DIAGNOSIS — O139 Gestational [pregnancy-induced] hypertension without significant proteinuria, unspecified trimester: Secondary | ICD-10-CM

## 2013-08-05 MED ORDER — DIBUCAINE 1 % RE OINT: 1.0000 "application " | TOPICAL_OINTMENT | RECTAL | Status: DC | PRN

## 2013-08-05 MED ORDER — ZOLPIDEM TARTRATE 5 MG PO TABS: 5.0000 mg | ORAL_TABLET | Freq: Every evening | ORAL | Status: DC | PRN

## 2013-08-05 MED ORDER — BENZOCAINE-MENTHOL 20-0.5 % EX AERO
1.0000 "application " | INHALATION_SPRAY | CUTANEOUS | Status: DC | PRN
  Administered 2013-08-06: 1 via TOPICAL
  Filled 2013-08-05: qty 56

## 2013-08-05 MED ORDER — WITCH HAZEL-GLYCERIN EX PADS: 1.0000 "application " | MEDICATED_PAD | CUTANEOUS | Status: DC | PRN

## 2013-08-05 MED ORDER — SENNOSIDES-DOCUSATE SODIUM 8.6-50 MG PO TABS
2.0000 | ORAL_TABLET | Freq: Every day | ORAL | Status: DC
  Administered 2013-08-05 – 2013-08-06 (×2): 2 via ORAL

## 2013-08-05 MED ORDER — ONDANSETRON HCL 4 MG PO TABS: 4.0000 mg | ORAL_TABLET | ORAL | Status: DC | PRN

## 2013-08-05 MED ORDER — SIMETHICONE 80 MG PO CHEW: 80.0000 mg | CHEWABLE_TABLET | ORAL | Status: DC | PRN

## 2013-08-05 MED ORDER — LANOLIN HYDROUS EX OINT: TOPICAL_OINTMENT | CUTANEOUS | Status: DC | PRN

## 2013-08-05 MED ORDER — FERROUS SULFATE 325 (65 FE) MG PO TABS
325.0000 mg | ORAL_TABLET | Freq: Every day | ORAL | Status: DC
Start: 2013-08-06 — End: 2013-08-07
  Administered 2013-08-06 – 2013-08-07 (×2): 325 mg via ORAL
  Filled 2013-08-05 (×2): qty 1

## 2013-08-05 MED ORDER — PRENATAL MULTIVITAMIN CH
1.0000 | ORAL_TABLET | Freq: Every day | ORAL | Status: DC
  Administered 2013-08-06 – 2013-08-07 (×2): 1 via ORAL
  Filled 2013-08-05 (×2): qty 1

## 2013-08-05 MED ORDER — OXYTOCIN 40 UNITS IN LACTATED RINGERS INFUSION - SIMPLE MED
1.0000 m[IU]/min | INTRAVENOUS | Status: DC
  Administered 2013-08-05: 1 m[IU]/min via INTRAVENOUS
  Administered 2013-08-05: 10 m[IU]/min via INTRAVENOUS

## 2013-08-05 MED ORDER — FENTANYL CITRATE 0.05 MG/ML IJ SOLN
100.0000 ug | INTRAMUSCULAR | Status: DC | PRN
  Administered 2013-08-05 (×6): 100 ug via INTRAVENOUS
  Filled 2013-08-05 (×6): qty 2

## 2013-08-05 MED ORDER — TETANUS-DIPHTH-ACELL PERTUSSIS 5-2.5-18.5 LF-MCG/0.5 IM SUSP: 0.5000 mL | Freq: Once | INTRAMUSCULAR | Status: DC

## 2013-08-05 MED ORDER — IBUPROFEN 600 MG PO TABS
600.0000 mg | ORAL_TABLET | Freq: Four times a day (QID) | ORAL | Status: DC
  Administered 2013-08-05 – 2013-08-07 (×7): 600 mg via ORAL
  Filled 2013-08-05 (×7): qty 1

## 2013-08-05 MED ORDER — OXYCODONE-ACETAMINOPHEN 5-325 MG PO TABS: 1.0000 | ORAL_TABLET | ORAL | Status: DC | PRN

## 2013-08-05 MED ORDER — DIPHENHYDRAMINE HCL 25 MG PO CAPS: 25.0000 mg | ORAL_CAPSULE | Freq: Four times a day (QID) | ORAL | Status: DC | PRN

## 2013-08-05 MED ORDER — ONDANSETRON HCL 4 MG/2ML IJ SOLN: 4.0000 mg | INTRAMUSCULAR | Status: DC | PRN

## 2013-08-05 NOTE — Progress Notes (Signed)
Ariel Turner is a 22 y.o. G1P0 at [redacted]w[redacted]d admitted for induction of labor due to [facility].  Subjective: Pt feeling contractions more painfully now.   Objective: BP 134/78  Pulse 76  Temp(Src) 97.8 F (36.6 C) (Oral)  Resp 20  Ht 5' 2.75" (1.594 m)  Wt 185 lb 9.6 oz (84.188 kg)  BMI 33.13 kg/m2  LMP 11/01/2012      FHT:  FHR: 140 bpm, variability: moderate,  accelerations:  Present,  decelerations:  Absent UC:   irregular, every 7-8 minutes SVE:   Dilation: 6 Effacement (%): 50 Cervical Position: Posterior Station: -2 Presentation: Vertex Exam by:: [employer] RN   Labs: Lab Results  Component Value Date   WBC 9.4 08/03/2013   HGB 12.4 08/03/2013   HCT 36.1 08/03/2013   MCV 80.9 08/03/2013   PLT 230 08/03/2013    Assessment / Plan: Induction of labor due to gestational hypertension  Labor: ctx pattern spaced out, cervix unchanged. Will add low dose pitocin. Preeclampsia:  no signs or symptoms of toxicity and labs stable Fetal Wellbeing:  Category I Pain Control:  IV fentanyl, may have epidural if desires (although trying without) I/D:  n/a Anticipated MOD:  NSVD  Levert Feinstein 08/05/2013, 3:29 AM

## 2013-08-05 NOTE — Progress Notes (Signed)
Sponge, sharps, and instrument counts correct after delivery.

## 2013-08-05 NOTE — Progress Notes (Signed)
Pt requesting more IV pain med- discussed all options including epidural.  Pt declines epidural @ this time, unable to give more IV med d/t advanced dilatation, rationale explained to pt and family.  Will plan to recheck cervix in about 30 min to reassess.  All express understanding and agreement with plan of care.  Emotional support and encouragement given.

## 2013-08-05 NOTE — Progress Notes (Signed)
Ariel Turner is a 22 y.o. G1P0 at [redacted]w[redacted]d admitted for induction of labor due to [place].  Subjective: Pt feeling contractions now every few minutes. Foley bulb has fallen out. Family at bedside.   Objective: BP 140/88  Pulse 78  Temp(Src) 97.9 F (36.6 C) (Oral)  Resp 20  Ht 5' 2.75" (1.594 m)  Wt 185 lb 9.6 oz (84.188 kg)  BMI 33.13 kg/m2  LMP 11/01/2012      FHT:  FHR: 150 bpm, variability: moderate,  accelerations:  Present,  decelerations:  Absent UC:   regular, every 4-5 minutes SVE:   Dilation: 6 Effacement (%): 30 Cervical Position: Posterior Station: -2 Presentation: Vertex Exam by:: Muhammad, CNM   Labs: Lab Results  Component Value Date   WBC 9.4 08/03/2013   HGB 12.4 08/03/2013   HCT 36.1 08/03/2013   MCV 80.9 08/03/2013   PLT 230 08/03/2013    Assessment / Plan: Induction of labor due to gestational hypertension  Labor: Progressing normally s/p foley bulb. Pt prefers to avoid pitocin if possible. Will give another 2-3 hours and recheck, consider adding pitocin then if no change. Preeclampsia:  no signs or symptoms of toxicity and labs stable Fetal Wellbeing:  Category I Pain Control:  Labor support without medications I/D:  n/a Anticipated MOD:  NSVD  Levert Feinstein 08/05/2013, 12:53 AM

## 2013-08-05 NOTE — Progress Notes (Signed)
Ariel Turner is a 22 y.o. G1P0 at [redacted]w[redacted]d admitted for induction of labor due to [facility].  Subjective: Pt feeling contractions more painfully now.   Objective: BP 118/72  Pulse 84  Temp(Src) 98.1 F (36.7 C) (Oral)  Resp 20  Ht 5' 2.75" (1.594 m)  Wt 185 lb 9.6 oz (84.188 kg)  BMI 33.13 kg/m2  LMP 11/01/2012      FHT:  FHR: 130 bpm, variability: moderate,  accelerations:  Present,  decelerations:  Present one variable UC:   regular, every 2-4 minutes SVE:   Dilation: 7 Effacement (%): 90 Cervical Position: Posterior Station: -1 Presentation: Vertex Exam by:: SH RNC   Labs: Lab Results  Component Value Date   WBC 9.4 08/03/2013   HGB 12.4 08/03/2013   HCT 36.1 08/03/2013   MCV 80.9 08/03/2013   PLT 230 08/03/2013    Assessment / Plan: Induction of labor due to gestational hypertension  Labor: progressing well on pitocin Preeclampsia:  no signs or symptoms of toxicity and labs stable Fetal Wellbeing:  Category II Pain Control:  IV fentanyl, may have epidural if desires (although trying without) I/D:  GBS negative. Monitor closely for chorioamnionitis given prolonged rupture of membranes. Anticipated MOD:  NSVD  Levert Feinstein 08/05/2013, 8:50 AM

## 2013-08-05 NOTE — Progress Notes (Signed)
Ariel Turner is a 22 y.o. G1P0 at [redacted]w[redacted]d admitted for induction of labor due to Hypertension.  Subjective: Pain controlled on IV pain meds and moving. No epidural at this time  Objective: BP 126/71  Pulse 72  Temp(Src) 98.1 F (36.7 C) (Oral)  Resp 20  Ht 5' 2.75" (1.594 m)  Wt 84.188 kg (185 lb 9.6 oz)  BMI 33.13 kg/m2  LMP 11/01/2012     Filed Vitals:   08/05/13 1007 08/05/13 1108 08/05/13 1109 08/05/13 1200  BP: 126/94  108/65 126/71  Pulse: 86  73 72  Temp: 97.9 F (36.6 C)  98.1 F (36.7 C)   TempSrc: Oral  Oral   Resp: 20 20 20 20   Height:      Weight:         FHT:  FHR: 120-130s  bpm, variability: moderate,  accelerations:  Present,  decelerations:  Absent UC:   none, regular, every 2-4 minutes SVE:   Dilation: Lip/rim Effacement (%): 90 Station: -1 Exam by:: Dr. Ike Bene  Labs: Lab Results  Component Value Date   WBC 9.4 08/03/2013   HGB 12.4 08/03/2013   HCT 36.1 08/03/2013   MCV 80.9 08/03/2013   PLT 230 08/03/2013    Assessment / Plan: Induction of labor due to gestational hypertension,  progressing well on pitocin  Labor: Progressing normally and continue Pit at 24mu/min. Correction previous note, now AROM for ~24 hrs Preeclampsia:  no signs or symptoms of toxicity, BPs not in severe range Labs reassuring  Fetal Wellbeing:  Category I Pain Control:  Labor support without medications and Fentanyl I/D:  n/a Anticipated MOD:  NSVD  Ariel Turner 08/05/2013, 12:26 PM

## 2013-08-05 NOTE — Progress Notes (Signed)
Pt encouraged to use birthing ball, sit on toilet, leaning over bed to assist with rotation of head.  Pt very tired but coping with breathing techniques and family support.  Emotional support and comfort measures offered.

## 2013-08-05 NOTE — Progress Notes (Addendum)
Ariel Turner is a 22 y.o. G1P0 at [redacted]w[redacted]d admitted for induction of labor due to Hypertension.  Subjective: Pain controlled on IV pain meds and moving. No epidural at this time  Objective: BP 108/65  Pulse 73  Temp(Src) 98.1 F (36.7 C) (Oral)  Resp 20  Ht 5' 2.75" (1.594 m)  Wt 84.188 kg (185 lb 9.6 oz)  BMI 33.13 kg/m2  LMP 11/01/2012     Filed Vitals:   08/05/13 0830 08/05/13 1007 08/05/13 1108 08/05/13 1109  BP: 118/72 126/94  108/65  Pulse: 84 86  73  Temp:  97.9 F (36.6 C)  98.1 F (36.7 C)  TempSrc:  Oral  Oral  Resp: 20 20 20 20   Height:      Weight:         FHT:  FHR: 120-130s  bpm, variability: moderate,  accelerations:  Present,  decelerations:  Absent UC:   none, regular, every 2-4 minutes SVE:   Dilation: 8 Effacement (%): 90 Station: -2 Exam by:: Ariel Turner  Labs: Lab Results  Component Value Date   WBC 9.4 08/03/2013   HGB 12.4 08/03/2013   HCT 36.1 08/03/2013   MCV 80.9 08/03/2013   PLT 230 08/03/2013    Assessment / Plan: Induction of labor due to gestational hypertension,  progressing well on pitocin  Labor: Progressing on Pitocin, will continue to increase then AROM Preeclampsia:  no signs or symptoms of toxicity, BPs not in severe range Labs reassuring  Fetal Wellbeing:  Category I Pain Control:  Labor support without medications and Fentanyl I/D:  n/a Anticipated MOD:  NSVD  Ariel Turner 08/05/2013, 11:33 AM

## 2013-08-06 NOTE — Progress Notes (Signed)
UR chart review completed.  

## 2013-08-06 NOTE — Progress Notes (Signed)
Post Partum Day 1 Subjective: no complaints, up ad lib, voiding and tolerating PO  Objective: Blood pressure 108/74, pulse 82, temperature 98.3 F (36.8 C), temperature source Oral, resp. rate 20, height 5' 2.75" (1.594 m), weight 185 lb 9.6 oz (84.188 kg), last menstrual period 11/01/2012, SpO2 99.00%, unknown if currently breastfeeding.  Physical Exam:  General: alert, cooperative and no distress Lochia: appropriate Uterine Fundus: firm Incision: n/a DVT Evaluation: No evidence of DVT seen on physical exam. Negative Homan's sign. No cords or calf tenderness. No significant calf/ankle edema.   Recent Labs  08/03/13 2005  HGB 12.4  HCT 36.1    Assessment/Plan: Plan for discharge tomorrow, Breastfeeding, Lactation consult and Contraception undecided, need to continue discussion   LOS: 3 days   Levert Feinstein 08/06/2013, 7:00 AM

## 2013-08-06 NOTE — Progress Notes (Signed)
I spoke with and examined patient and agree with resident's note and plan of care.  Tawana Scale, MD OB Fellow 08/06/2013 7:49 AM

## 2013-08-07 ENCOUNTER — Encounter: Payer: Self-pay | Admitting: Family Medicine

## 2013-08-07 MED ORDER — NORETHINDRONE 0.35 MG PO TABS: 1.0000 | ORAL_TABLET | Freq: Every day | ORAL | Status: DC

## 2013-08-07 MED ORDER — IBUPROFEN 600 MG PO TABS: 600.0000 mg | ORAL_TABLET | Freq: Four times a day (QID) | ORAL | Status: DC | PRN

## 2013-08-07 NOTE — Discharge Summary (Signed)
Obstetric Discharge Summary Reason for Admission: induction of labor secondary to gestational hypertension Prenatal Procedures: none Intrapartum Procedures: spontaneous vaginal delivery Postpartum Procedures: none Complications-Operative and Postpartum: vaginal laceration Hemoglobin  Date Value Range Status  08/03/2013 12.4  12.0 - 15.0 g/dL Final     HCT  Date Value Range Status  08/03/2013 36.1  36.0 - 46.0 % Final   BP 116/67  Pulse 70  Temp(Src) 97.8 F (36.6 C) (Oral)  Resp 18  Ht 5' 2.75" (1.594 m)  Wt 185 lb 9.6 oz (84.188 kg)  BMI 33.13 kg/m2  SpO2 99%  LMP 11/01/2012  Physical Exam:  General: alert, cooperative and no distress Lochia: appropriate Uterine Fundus: firm Incision: n/a DVT Evaluation: No evidence of DVT seen on physical exam. Negative Homan's sign. No cords or calf tenderness.  Hospital Course: Ariel Turner is a 22 y.o. G1P1001 at [redacted]w[redacted]d who was admitted to the hospital after presenting for induction of labor secondary to gestational hypertension. She was induced initially with cytotec, followed by a foley bulb, then pitocin. Her blood pressures did not ever reach severe range and thus she never required IV magnesium therapy. She had a normal spontaneous vaginal delivery which was complicated by several second degree vaginal lacerations, which were repaired with suture. Her postpartum course was unremarkable. Pt is breast feeding and plans to use progesterone only pills for contraception. She will follow up with Dr. Pollie Meyer at [facility] in 6 weeks.   Discharge Diagnoses: Term Pregnancy-delivered  Discharge Information: Date: 08/07/2013 Activity: pelvic rest Diet: routine Medications: PNV, Ibuprofen and Iron Condition: stable Instructions: refer to practice specific booklet Discharge to: home  Follow-up Information   Follow up with Levert Feinstein, MD On 09/12/2013. (at 1:30pm for routine postpartum checkup)    Specialty:   Family Medicine   Contact information:   [address] [place] [address] [phone]        Medication List         ferrous sulfate 325 (65 FE) MG tablet  Take 1 tablet (325 mg total) by mouth daily with breakfast.     ibuprofen 600 MG tablet  Commonly known as:  ADVIL,MOTRIN  Take 1 tablet (600 mg total) by mouth every 6 (six) hours as needed for pain.     norethindrone 0.35 MG tablet  Commonly known as:  MICRONOR,CAMILA,ERRIN  Take 1 tablet (0.35 mg total) by mouth daily. Start taking 3 weeks after delivery of baby     prenatal multivitamin Tabs tablet  Take 1 tablet by mouth daily at 12 noon.        Newborn Data: Live born female  Birth Weight: 7 lb 7 oz (3374 g) APGAR: 9, 9  Home with mother.  Levert Feinstein 08/07/2013, 11:49 AM  I have seen and examined this patient and agree with above documentation in the resident's note.   Rulon Abide, M.D. [facility] Fellow 08/07/2013 12:40 PM

## 2013-08-07 NOTE — Discharge Summary (Signed)
Attestation of Attending Supervision of Fellow: Evaluation and management procedures were performed by the Fellow under my supervision and collaboration.  I have reviewed the Fellow's note and chart, and I agree with the management and plan.    

## 2013-08-08 ENCOUNTER — Other Ambulatory Visit: Payer: Self-pay

## 2013-08-13 ENCOUNTER — Encounter: Payer: Self-pay | Admitting: Family Medicine

## 2013-09-12 ENCOUNTER — Ambulatory Visit (INDEPENDENT_AMBULATORY_CARE_PROVIDER_SITE_OTHER): Payer: Medicaid Other | Admitting: Family Medicine

## 2013-09-12 ENCOUNTER — Encounter: Payer: Self-pay | Admitting: Family Medicine

## 2013-09-12 DIAGNOSIS — A499 Bacterial infection, unspecified: Secondary | ICD-10-CM

## 2013-09-12 DIAGNOSIS — N898 Other specified noninflammatory disorders of vagina: Secondary | ICD-10-CM

## 2013-09-12 DIAGNOSIS — N760 Acute vaginitis: Secondary | ICD-10-CM

## 2013-09-12 DIAGNOSIS — B9689 Other specified bacterial agents as the cause of diseases classified elsewhere: Secondary | ICD-10-CM

## 2013-09-12 LAB — POCT WET PREP (WET MOUNT): Clue Cells Wet Prep Whiff POC: POSITIVE

## 2013-09-12 MED ORDER — METRONIDAZOLE 0.75 % VA GEL: 1.0000 | Freq: Two times a day (BID) | VAGINAL | Status: DC

## 2013-09-12 NOTE — Patient Instructions (Addendum)
It was great to see you again today!  Everything looks like it's going well. Get the prescription filled for the birth control. Take it at the same time every day. Use a condom as an additional backup method of birth control for the first week while you're taking it. I recommend you resume the prenatal vitamin. I sent in a medicine to treat the bacterial vaginosis. It is a vaginal cream to use twice a day for 5 days.  Follow up in April for your pap smear, or sooner as needed.  Be well, Dr. Pollie Meyer  Bacterial Vaginosis Bacterial vaginosis (BV) is a vaginal infection where the normal balance of bacteria in the vagina is disrupted. The normal balance is then replaced by an overgrowth of certain bacteria. There are several different kinds of bacteria that can cause BV. BV is the most common vaginal infection in women of childbearing age. CAUSES   The cause of BV is not fully understood. BV develops when there is an increase or imbalance of harmful bacteria.  Some activities or behaviors can upset the normal balance of bacteria in the vagina and put women at increased risk including:  Having a new sex partner or multiple sex partners.  Douching.  Using an intrauterine device (IUD) for contraception.  It is not clear what role sexual activity plays in the development of BV. However, women that have never had sexual intercourse are rarely infected with BV. Women do not get BV from toilet seats, bedding, swimming pools or from touching objects around them.  SYMPTOMS   Grey vaginal discharge.  A fish-like odor with discharge, especially after sexual intercourse.  Itching or burning of the vagina and vulva.  Burning or pain with urination.  Some women have no signs or symptoms at all. DIAGNOSIS  Your caregiver must examine the vagina for signs of BV. Your caregiver will perform lab tests and look at the sample of vaginal fluid through a microscope. They will look for bacteria and  abnormal cells (clue cells), a pH test higher than 4.5, and a positive amine test all associated with BV.  RISKS AND COMPLICATIONS   Pelvic inflammatory disease (PID).  Infections following gynecology surgery.  Developing HIV.  Developing herpes virus. TREATMENT  Sometimes BV will clear up without treatment. However, all women with symptoms of BV should be treated to avoid complications, especially if gynecology surgery is planned. Female partners generally do not need to be treated. However, BV may spread between female sex partners so treatment is helpful in preventing a recurrence of BV.   BV may be treated with antibiotics. The antibiotics come in either pill or vaginal cream forms. Either can be used with nonpregnant or pregnant women, but the recommended dosages differ. These antibiotics are not harmful to the baby.  BV can recur after treatment. If this happens, a second round of antibiotics will often be prescribed.  Treatment is important for pregnant women. If not treated, BV can cause a premature delivery, especially for a pregnant woman who had a premature birth in the past. All pregnant women who have symptoms of BV should be checked and treated.  For chronic reoccurrence of BV, treatment with a type of prescribed gel vaginally twice a week is helpful. HOME CARE INSTRUCTIONS   Finish all medication as directed by your caregiver.  Do not have sex until treatment is completed.  Tell your sexual partner that you have a vaginal infection. They should see their caregiver and be treated if they  have problems, such as a mild rash or itching.  Practice safe sex. Use condoms. Only have 1 sex partner. PREVENTION  Basic prevention steps can help reduce the risk of upsetting the natural balance of bacteria in the vagina and developing BV:  Do not have sexual intercourse (be abstinent).  Do not douche.  Use all of the medicine prescribed for treatment of BV, even if the signs and  symptoms go away.  Tell your sex partner if you have BV. That way, they can be treated, if needed, to prevent reoccurrence. SEEK MEDICAL CARE IF:   Your symptoms are not improving after 3 days of treatment.  You have increased discharge, pain, or fever. MAKE SURE YOU:   Understand these instructions.  Will watch your condition.  Will get help right away if you are not doing well or get worse. FOR MORE INFORMATION  Division of STD Prevention (DSTDP), Centers for Disease Control and Prevention: SolutionApps.co.za American Social Health Association (ASHA): www.ashastd.org  Document Released: 11/22/2005 Document Revised: 02/14/2012 Document Reviewed: 05/15/2009 [facility] Patient Information 2014 [place], [place].

## 2013-09-14 NOTE — Progress Notes (Signed)
Patient ID: Ariel Turner, female   DOB: Feb 15, 1991, 22 y.o.   MRN: [id number]  HPI:  Postpartum visit: Patient is breast-feeding and bottlefeeding. States breast-feeding is going well. States her mood is good, she is bonding well with the baby. She plans to use the progesterone only pill for, however not picked this up from her pharmacy yet. She denies any pelvic pain or discomfort. She has not resumed sexual intercourse. She has not been taking a prenatal vitamin. She has noted some odor and vaginal discharge, thinks she may have BV again. Has no other concerns today.  ROS: See HPI  PMFSH: Normal vaginal delivery approximately 6 weeks ago. She did have a second-degree laceration which was repaired at the time of delivery.  PHYSICAL EXAM: BP 129/89  Pulse 74  Temp(Src) 98.5 F (36.9 C) (Oral)  Ht 5\' 2"  (1.575 m)  Wt 173 lb (78.472 kg)  BMI 31.63 kg/m2  Breastfeeding? Yes Gen: No acute distress Heart: Regular rate and rhythm, no murmurs Lungs: Normal respiratory effort, clear to auscultation bilaterally Abdomen: Soft nontender to palpation Breasts: Normal in appearance, and no signs of infection or erythema. No masses palpable. GU: Well-healed laceration. Blind wet prep obtained. Did not do speculum exam. Neuro: Grossly nonfocal, speech intact  ASSESSMENT/PLAN:  # Postpartum visit: -PHQ-9 done today, score is 0, no signs of postpartum depression. -Encouraged patient to begin taking progesterone only contraception. Advised backup method of contraception for first week of taking the pill. Reiterated importance of taking pill the same time each day. Did clear her to resume sexual intercourse. -Recommended patient continue to take prenatal vitamin -Wet prep suggestive of BV, will send in vaginal MetroGel as patient is breast-feeding and thus should not take oral Flagyl. 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  FOLLOW UP: F/u in April for pap  smear  [place] J. Pollie Meyer, MD [facility]

## 2013-10-11 ENCOUNTER — Other Ambulatory Visit: Payer: Self-pay

## 2013-11-06 ENCOUNTER — Ambulatory Visit: Payer: Medicaid Other | Admitting: Family Medicine

## 2014-06-03 ENCOUNTER — Encounter: Payer: Self-pay | Admitting: Family Medicine

## 2014-06-03 ENCOUNTER — Ambulatory Visit (INDEPENDENT_AMBULATORY_CARE_PROVIDER_SITE_OTHER): Payer: Self-pay | Admitting: Family Medicine

## 2014-06-03 ENCOUNTER — Other Ambulatory Visit (HOSPITAL_COMMUNITY)
Admission: RE | Admit: 2014-06-03 | Discharge: 2014-06-03 | Disposition: A | Discharge status: Home / Self Care | Payer: Self-pay | Source: Ambulatory Visit | Attending: Family Medicine | Admitting: Family Medicine

## 2014-06-03 VITALS — BP 125/84 | HR 80 | Ht 62.0 in | Wt 208.0 lb

## 2014-06-03 DIAGNOSIS — Z113 Encounter for screening for infections with a predominantly sexual mode of transmission: Secondary | ICD-10-CM | POA: Insufficient documentation

## 2014-06-03 DIAGNOSIS — N898 Other specified noninflammatory disorders of vagina: Secondary | ICD-10-CM | POA: Insufficient documentation

## 2014-06-03 LAB — POCT WET PREP (WET MOUNT): Clue Cells Wet Prep Whiff POC: POSITIVE

## 2014-06-03 MED ORDER — METRONIDAZOLE 500 MG PO TABS: 500.0000 mg | ORAL_TABLET | Freq: Two times a day (BID) | ORAL | Status: DC

## 2014-06-03 NOTE — Progress Notes (Signed)
   Ariel Turner is a 23 y.o. female [facility]   HPI:  *Vaginal Discharge Previously diagnosed with BV while pregnant. Started with discharge and odor 2 months ago.Tried monostat with no relief. No itch or irritation. No dysuria. No recent sexual intercourse. No antibiotic use. LMP last month. Currently bottle feeding. No fevers, chills, nausea, or vomiting.  The following portions of the patient's history were reviewed and updated as appropriate: allergies, current medications, past medical history, family and social history, and problem list.   Past Medical History  Diagnosis Date  . Anemia   . Heart murmur     during infancy  . Pregnancy induced hypertension     ROS as above.    Medications reviewed. Current Outpatient Prescriptions  Medication Sig Dispense Refill  . ferrous sulfate 325 (65 FE) MG tablet Take 1 tablet (325 mg total) by mouth daily with breakfast.  60 tablet  3  . ibuprofen (ADVIL,MOTRIN) 600 MG tablet Take 1 tablet (600 mg total) by mouth every 6 (six) hours as needed for pain.  30 tablet  0  . metroNIDAZOLE (METROGEL VAGINAL) 0.75 % vaginal gel Place 1 Applicatorful vaginally 2 (two) times daily. For 5 days  70 g  0  . norethindrone (MICRONOR,CAMILA,ERRIN) 0.35 MG tablet Take 1 tablet (0.35 mg total) by mouth daily. Start taking 3 weeks after delivery of baby  1 Package  11  . Prenatal Vit-Fe Fumarate-FA (PRENATAL MULTIVITAMIN) TABS tablet Take 1 tablet by mouth daily at 12 noon.       No current facility-administered medications for this visit.    Exam:  There were no vitals taken for this visit. Gen: Well NAD HEENT: EOMI,  MMM Abd: NABS, NT, ND Exts: Non edematous BL  LE, warm and well perfused.  GU: Labia normal. Vaginal wall well rugated with no lesions. Mild thick discharge present at cervical os. No cervical motion tenderness.  No results found for this or any previous visit (from the past 72  hour(s)).  A/P: See problem list  No problem-specific assessment & plan notes found for this encounter.   Katina Degreealeb M. Jimmey RalphParker, MD 06/03/2014 4:35 PM    ------------------------------ Upper Level Addendum  Pt seen and agree w/ plan  Starting metro   Shelly Flattenavid Merrell, MD

## 2014-06-03 NOTE — Assessment & Plan Note (Addendum)
Possibly BV as patient has prior history during pregnancy. Possibly candidiasis. Wet mount shows: Clue cells Sent off GC/chlam

## 2014-06-03 NOTE — Patient Instructions (Addendum)
Thank you for coming to the clinic today. You were seen for vaginal discharge. You have bacterial vaginosis. Please start the metronidazole Bacterial Vaginosis Bacterial vaginosis is a vaginal infection that occurs when the normal balance of bacteria in the vagina is disrupted. It results from an overgrowth of certain bacteria. This is the most common vaginal infection in women of childbearing age. Treatment is important to prevent complications, especially in pregnant women, as it can cause a premature delivery. CAUSES  Bacterial vaginosis is caused by an increase in harmful bacteria that are normally present in smaller amounts in the vagina. Several different kinds of bacteria can cause bacterial vaginosis. However, the reason that the condition develops is not fully understood. RISK FACTORS Certain activities or behaviors can put you at an increased risk of developing bacterial vaginosis, including:  Having a new sex partner or multiple sex partners.  Douching.  Using an intrauterine device (IUD) for contraception. Women do not get bacterial vaginosis from toilet seats, bedding, swimming pools, or contact with objects around them. SIGNS AND SYMPTOMS  Some women with bacterial vaginosis have no signs or symptoms. Common symptoms include:  Grey vaginal discharge.  A fishlike odor with discharge, especially after sexual intercourse.  Itching or burning of the vagina and vulva.  Burning or pain with urination. DIAGNOSIS  Your health care provider will take a medical history and examine the vagina for signs of bacterial vaginosis. A sample of vaginal fluid may be taken. Your health care provider will look at this sample under a microscope to check for bacteria and abnormal cells. A vaginal pH test may also be done.  TREATMENT  Bacterial vaginosis may be treated with antibiotic medicines. These may be given in the form of a pill or a vaginal cream. A second round of antibiotics may be  prescribed if the condition comes back after treatment.  HOME CARE INSTRUCTIONS   Only take over-the-counter or prescription medicines as directed by your health care provider.  If antibiotic medicine was prescribed, take it as directed. Make sure you finish it even if you start to feel better.  Do not have sex until treatment is completed.  Tell all sexual partners that you have a vaginal infection. They should see their health care provider and be treated if they have problems, such as a mild rash or itching.  Practice safe sex by using condoms and only having one sex partner. SEEK MEDICAL CARE IF:   Your symptoms are not improving after 3 days of treatment.  You have increased discharge or pain.  You have a fever. MAKE SURE YOU:   Understand these instructions.  Will watch your condition.  Will get help right away if you are not doing well or get worse. FOR MORE INFORMATION  Centers for Disease Control and Prevention, Division of STD Prevention: SolutionApps.co.zawww.cdc.gov/std American Sexual Health Association (ASHA): www.ashastd.org  Document Released: 11/22/2005 Document Revised: 09/12/2013 Document Reviewed: 07/04/2013 [facility] Patient Information 2015 [place]. This information is not intended to replace advice given to you by your health care provider. Make sure you discuss any questions you have with your health care provider.

## 2014-10-07 ENCOUNTER — Encounter: Payer: Self-pay | Admitting: Family Medicine

## 2015-08-15 ENCOUNTER — Other Ambulatory Visit (HOSPITAL_COMMUNITY)
Admission: RE | Admit: 2015-08-15 | Discharge: 2015-08-15 | Disposition: A | Discharge status: Home / Self Care | Payer: Self-pay | Source: Ambulatory Visit | Attending: Family Medicine | Admitting: Family Medicine

## 2015-08-15 ENCOUNTER — Encounter: Payer: Self-pay | Admitting: Family Medicine

## 2015-08-15 ENCOUNTER — Ambulatory Visit (INDEPENDENT_AMBULATORY_CARE_PROVIDER_SITE_OTHER): Payer: Self-pay | Admitting: Family Medicine

## 2015-08-15 VITALS — BP 131/78 | HR 55 | Temp 97.6°F | Wt 195.0 lb

## 2015-08-15 DIAGNOSIS — Z113 Encounter for screening for infections with a predominantly sexual mode of transmission: Secondary | ICD-10-CM | POA: Insufficient documentation

## 2015-08-15 DIAGNOSIS — L0231 Cutaneous abscess of buttock: Secondary | ICD-10-CM

## 2015-08-15 DIAGNOSIS — N898 Other specified noninflammatory disorders of vagina: Secondary | ICD-10-CM

## 2015-08-15 DIAGNOSIS — IMO0002 Reserved for concepts with insufficient information to code with codable children: Secondary | ICD-10-CM

## 2015-08-15 DIAGNOSIS — Z659 Problem related to unspecified psychosocial circumstances: Secondary | ICD-10-CM

## 2015-08-15 DIAGNOSIS — T148 Other injury of unspecified body region: Secondary | ICD-10-CM

## 2015-08-15 DIAGNOSIS — Z01411 Encounter for gynecological examination (general) (routine) with abnormal findings: Secondary | ICD-10-CM | POA: Insufficient documentation

## 2015-08-15 DIAGNOSIS — Z124 Encounter for screening for malignant neoplasm of cervix: Secondary | ICD-10-CM

## 2015-08-15 DIAGNOSIS — R51 Headache: Secondary | ICD-10-CM

## 2015-08-15 DIAGNOSIS — W57XXXA Bitten or stung by nonvenomous insect and other nonvenomous arthropods, initial encounter: Secondary | ICD-10-CM

## 2015-08-15 DIAGNOSIS — R896 Abnormal cytological findings in specimens from other organs, systems and tissues: Secondary | ICD-10-CM

## 2015-08-15 DIAGNOSIS — R519 Headache, unspecified: Secondary | ICD-10-CM

## 2015-08-15 DIAGNOSIS — N760 Acute vaginitis: Secondary | ICD-10-CM | POA: Insufficient documentation

## 2015-08-15 LAB — POCT URINE PREGNANCY: PREG TEST UR: NEGATIVE

## 2015-08-15 LAB — POCT WET PREP (WET MOUNT): Clue Cells Wet Prep Whiff POC: NEGATIVE

## 2015-08-15 MED ORDER — SUMATRIPTAN SUCCINATE 25 MG PO TABS: ORAL_TABLET | ORAL | Status: DC

## 2015-08-15 MED ORDER — DOXYCYCLINE HYCLATE 100 MG PO TABS
100.0000 mg | ORAL_TABLET | Freq: Two times a day (BID) | ORAL | Status: DC
Start: 2015-08-15 — End: 2016-10-05

## 2015-08-15 NOTE — Patient Instructions (Addendum)
See handout below on counseling Testing for infections today Take doxycycline  twice a day for 7 days for the headache and tick bite Send in migraine medicine, can take one pill if headache starts, repeat in 2 hours if not better Will call you with results Use warm compress several x per day on your bottom  Be well, Dr. Pollie Meyer      [facility]   [phone]  Provides information on mental health, intellectual/developmental disabilities & substance abuse services in [facility].   COUNSELING AGENCIES (Accepts Medicaid)  [facility]. [address]        [phone] *[facility] [address]      [phone]  [facility] [place]  [address]  [phone] (I) [facility] [address]"   [phone] (I) [facility] [phone] [address]  [phone] [facility] [address] [phone] (I) *[facility] [id number] [phone]   [phone] [facility] [address] [phone] [facility] for [facility] & Wellness         [phone] (I) *[facility] [address]                 [phone] (I) [facility] [address]              [phone] (I) *[facility] [address]    [phone] (I) [facility] [phone] [facility] [address]  [phone] *[facility] [address]                    [phone] (I)* *[facility] [address]   [phone]  (I) [employer] Espa[employer]/Interprete  * Psychiatric services/servicios psiquiatricos  COUNSELING- CRISIS - 24 hour availability [facility]:     [phone] [address], [place], [place] [address]   [facility] the [facility] [phone] (Domestic Violence, Rape & Victim Assistance )  [facility]   [phone] or [phone] [facility] and  Crisis Services)  [address] [place]                          Radiation protection practitioner Crisis Unit (24/7)             [phone]   [place] National Suicide Hotline    [phone] Len Childs)  [facility]   (Only from 8am-4pm)   [phone]

## 2015-08-16 LAB — RPR

## 2015-08-16 LAB — HEPATITIS PANEL, ACUTE
HCV AB: NEGATIVE
Hep A IgM: NONREACTIVE
Hep B C IgM: NONREACTIVE
Hepatitis B Surface Ag: NEGATIVE

## 2015-08-16 LAB — HIV ANTIBODY (ROUTINE TESTING W REFLEX): HIV 1&2 Ab, 4th Generation: NONREACTIVE

## 2015-08-19 LAB — CERVICOVAGINAL ANCILLARY ONLY
CHLAMYDIA, DNA PROBE: NEGATIVE
NEISSERIA GONORRHEA: NEGATIVE
TRICH (WINDOWPATH): NEGATIVE

## 2015-08-19 LAB — CYTOLOGY - PAP

## 2015-08-19 NOTE — Assessment & Plan Note (Signed)
Repeat pap collected during this visit.
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
## 2015-08-22 ENCOUNTER — Telehealth: Payer: Self-pay | Admitting: Family Medicine

## 2015-08-22 NOTE — Telephone Encounter (Signed)
Attempted to reach patient to discuss normal labs & pap. No answer, left VM asking her to return call. If still having vaginal discharge I can treat her empirically for BV. Will try again to call her on Monday. Latrelle Dodrill, MD

## 2015-08-28 MED ORDER — METRONIDAZOLE 500 MG PO TABS: 500.0000 mg | ORAL_TABLET | Freq: Two times a day (BID) | ORAL | Status: DC

## 2015-08-28 NOTE — Telephone Encounter (Signed)
Reached patient & relayed message below. Still having discharge with odor, would prefer to be treated for BV. Rx sent in to her pharmacy. Patient appreciative. Latrelle Dodrill, MD

## 2016-08-03 ENCOUNTER — Emergency Department (HOSPITAL_COMMUNITY): Payer: No Typology Code available for payment source

## 2016-08-03 ENCOUNTER — Encounter (HOSPITAL_COMMUNITY): Payer: Self-pay | Admitting: *Deleted

## 2016-08-03 ENCOUNTER — Emergency Department (HOSPITAL_COMMUNITY)
Admission: EM | Admit: 2016-08-03 | Discharge: 2016-08-04 | Disposition: A | Discharge status: Home / Self Care | Payer: No Typology Code available for payment source | Attending: Emergency Medicine | Admitting: Emergency Medicine

## 2016-08-03 DIAGNOSIS — Y999 Unspecified external cause status: Secondary | ICD-10-CM | POA: Insufficient documentation

## 2016-08-03 DIAGNOSIS — Y939 Activity, unspecified: Secondary | ICD-10-CM | POA: Diagnosis not present

## 2016-08-03 DIAGNOSIS — M791 Myalgia: Secondary | ICD-10-CM | POA: Diagnosis not present

## 2016-08-03 DIAGNOSIS — S3991XA Unspecified injury of abdomen, initial encounter: Secondary | ICD-10-CM | POA: Diagnosis present

## 2016-08-03 DIAGNOSIS — S301XXA Contusion of abdominal wall, initial encounter: Secondary | ICD-10-CM

## 2016-08-03 DIAGNOSIS — M7918 Myalgia, other site: Secondary | ICD-10-CM

## 2016-08-03 DIAGNOSIS — Y92410 Unspecified street and highway as the place of occurrence of the external cause: Secondary | ICD-10-CM | POA: Insufficient documentation

## 2016-08-03 DIAGNOSIS — S8002XA Contusion of left knee, initial encounter: Secondary | ICD-10-CM

## 2016-08-03 DIAGNOSIS — Z79899 Other long term (current) drug therapy: Secondary | ICD-10-CM | POA: Insufficient documentation

## 2016-08-03 LAB — I-STAT CHEM 8, ED
BUN: 11 mg/dL (ref 6–20)
CALCIUM ION: 1.2 mmol/L (ref 1.15–1.40)
CHLORIDE: 101 mmol/L (ref 101–111)
Creatinine, Ser: 0.7 mg/dL (ref 0.44–1.00)
Glucose, Bld: 106 mg/dL — ABNORMAL HIGH (ref 65–99)
HEMATOCRIT: 35 % — AB (ref 36.0–46.0)
Hemoglobin: 11.9 g/dL — ABNORMAL LOW (ref 12.0–15.0)
Potassium: 3.7 mmol/L (ref 3.5–5.1)
SODIUM: 141 mmol/L (ref 135–145)
TCO2: 25 mmol/L (ref 0–100)

## 2016-08-03 LAB — POC URINE PREG, ED: Preg Test, Ur: NEGATIVE

## 2016-08-03 MED ORDER — IOPAMIDOL (ISOVUE-300) INJECTION 61%
INTRAVENOUS | Status: AC
  Administered 2016-08-03: 100 mL
  Filled 2016-08-03: qty 100

## 2016-08-03 NOTE — ED Notes (Signed)
Patient transported to CT 

## 2016-08-03 NOTE — ED Notes (Signed)
Patient transported to X-ray 
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
## 2016-08-04 MED ORDER — NAPROXEN 500 MG PO TABS: 500.0000 mg | ORAL_TABLET | Freq: Two times a day (BID) | ORAL | 0 refills | Status: DC

## 2016-10-05 ENCOUNTER — Encounter (HOSPITAL_COMMUNITY): Payer: Self-pay | Admitting: Student

## 2016-10-05 ENCOUNTER — Inpatient Hospital Stay (HOSPITAL_COMMUNITY)
Admission: AD | Admit: 2016-10-05 | Discharge: 2016-10-05 | Disposition: A | Discharge status: Home / Self Care | Payer: Self-pay | Source: Ambulatory Visit | Attending: Family Medicine | Admitting: Family Medicine

## 2016-10-05 ENCOUNTER — Ambulatory Visit (INDEPENDENT_AMBULATORY_CARE_PROVIDER_SITE_OTHER): Payer: Self-pay | Admitting: Family Medicine

## 2016-10-05 ENCOUNTER — Encounter: Payer: Self-pay | Admitting: Family Medicine

## 2016-10-05 ENCOUNTER — Inpatient Hospital Stay (HOSPITAL_COMMUNITY): Payer: Self-pay

## 2016-10-05 VITALS — BP 120/74 | HR 80 | Temp 98.1°F | Wt 193.0 lb

## 2016-10-05 DIAGNOSIS — O26891 Other specified pregnancy related conditions, first trimester: Secondary | ICD-10-CM

## 2016-10-05 DIAGNOSIS — Z3A01 Less than 8 weeks gestation of pregnancy: Secondary | ICD-10-CM | POA: Insufficient documentation

## 2016-10-05 DIAGNOSIS — Z3491 Encounter for supervision of normal pregnancy, unspecified, first trimester: Secondary | ICD-10-CM

## 2016-10-05 DIAGNOSIS — N912 Amenorrhea, unspecified: Secondary | ICD-10-CM

## 2016-10-05 DIAGNOSIS — Z87891 Personal history of nicotine dependence: Secondary | ICD-10-CM | POA: Insufficient documentation

## 2016-10-05 DIAGNOSIS — O418X10 Other specified disorders of amniotic fluid and membranes, first trimester, not applicable or unspecified: Secondary | ICD-10-CM

## 2016-10-05 DIAGNOSIS — Z3201 Encounter for pregnancy test, result positive: Secondary | ICD-10-CM

## 2016-10-05 DIAGNOSIS — Z3490 Encounter for supervision of normal pregnancy, unspecified, unspecified trimester: Secondary | ICD-10-CM | POA: Insufficient documentation

## 2016-10-05 DIAGNOSIS — Z3481 Encounter for supervision of other normal pregnancy, first trimester: Secondary | ICD-10-CM

## 2016-10-05 DIAGNOSIS — R109 Unspecified abdominal pain: Secondary | ICD-10-CM | POA: Insufficient documentation

## 2016-10-05 DIAGNOSIS — O26899 Other specified pregnancy related conditions, unspecified trimester: Secondary | ICD-10-CM

## 2016-10-05 DIAGNOSIS — O208 Other hemorrhage in early pregnancy: Secondary | ICD-10-CM | POA: Insufficient documentation

## 2016-10-05 DIAGNOSIS — O468X1 Other antepartum hemorrhage, first trimester: Secondary | ICD-10-CM

## 2016-10-05 LAB — URINALYSIS, ROUTINE W REFLEX MICROSCOPIC
Bilirubin Urine: NEGATIVE
Glucose, UA: NEGATIVE mg/dL
KETONES UR: NEGATIVE mg/dL
NITRITE: NEGATIVE
PROTEIN: NEGATIVE mg/dL
Specific Gravity, Urine: 1.025 (ref 1.005–1.030)
pH: 6 (ref 5.0–8.0)

## 2016-10-05 LAB — CBC
HEMATOCRIT: 33.3 % — AB (ref 36.0–46.0)
HEMOGLOBIN: 10.6 g/dL — AB (ref 12.0–15.0)
MCH: 21.9 pg — ABNORMAL LOW (ref 26.0–34.0)
MCHC: 31.8 g/dL (ref 30.0–36.0)
MCV: 68.7 fL — ABNORMAL LOW (ref 78.0–100.0)
Platelets: 310 10*3/uL (ref 150–400)
RBC: 4.85 MIL/uL (ref 3.87–5.11)
RDW: 17.7 % — ABNORMAL HIGH (ref 11.5–15.5)
WBC: 5.3 10*3/uL (ref 4.0–10.5)

## 2016-10-05 LAB — URINE MICROSCOPIC-ADD ON

## 2016-10-05 LAB — WET PREP, GENITAL
Clue Cells Wet Prep HPF POC: NONE SEEN
SPERM: NONE SEEN
TRICH WET PREP: NONE SEEN
YEAST WET PREP: NONE SEEN

## 2016-10-05 LAB — HCG, QUANTITATIVE, PREGNANCY: HCG, BETA CHAIN, QUANT, S: 18230 m[IU]/mL — AB (ref <5)

## 2016-10-05 LAB — POCT URINE PREGNANCY: PREG TEST UR: POSITIVE — AB

## 2016-10-05 NOTE — Patient Instructions (Addendum)
Congratulations on your pregnancy!  Since we could not get an ultrasound until a week away, we are going to have you go to the MAU at [facility] to be sure the pregnancy is in the correct location.  Once you get [facility] set up, call here for a new OB appointment and a new OB lab visit.  [facility]: [address] [place], [place] [address] [phone]  Bring to the appointment: Go to social services & get denial letter Proof of pregnancy - see letter I'm giving you Proof of address $100 enrollment fee Photo ID     First Trimester of Pregnancy The first trimester of pregnancy is from week 1 until the end of week 12 (months 1 through 3). A week after a sperm fertilizes an egg, the egg will implant on the wall of the uterus. This embryo will begin to develop into a baby. Genes from you and your partner are forming the baby. The female genes determine whether the baby is a boy or a girl. At 6-8 weeks, the eyes and face are formed, and the heartbeat can be seen on ultrasound. At the end of 12 weeks, all the baby's organs are formed.  Now that you are pregnant, you will want to do everything you can to have a healthy baby. Two of the most important things are to get good prenatal care and to follow your health care provider's instructions. Prenatal care is all the medical care you receive before the baby's birth. This care will help prevent, find, and treat any problems during the pregnancy and childbirth. BODY CHANGES Your body goes through many changes during pregnancy. The changes vary from woman to woman.   You may gain or lose a couple of pounds at first.  You may feel sick to your stomach (nauseous) and throw up (vomit). If the vomiting is uncontrollable, call your health care provider.  You may tire easily.  You may develop headaches that can be relieved by medicines approved by your health care provider.  You may urinate more often. Painful urination may mean  you have a bladder infection.  You may develop heartburn as a result of your pregnancy.  You may develop constipation because certain hormones are causing the muscles that push waste through your intestines to slow down.  You may develop hemorrhoids or swollen, bulging veins (varicose veins).  Your breasts may begin to grow larger and become tender. Your nipples may stick out more, and the tissue that surrounds them (areola) may become darker.  Your gums may bleed and may be sensitive to brushing and flossing.  Dark spots or blotches (chloasma, mask of pregnancy) may develop on your face. This will likely fade after the baby is born.  Your menstrual periods will stop.  You may have a loss of appetite.  You may develop cravings for certain kinds of food.  You may have changes in your emotions from day to day, such as being excited to be pregnant or being concerned that something may go wrong with the pregnancy and baby.  You may have more vivid and strange dreams.  You may have changes in your hair. These can include thickening of your hair, rapid growth, and changes in texture. Some women also have hair loss during or after pregnancy, or hair that feels dry or thin. Your hair will most likely return to normal after your baby is born. WHAT TO EXPECT AT YOUR PRENATAL VISITS During a routine prenatal  visit:  You will be weighed to make sure you and the baby are growing normally.  Your blood pressure will be taken.  Your abdomen will be measured to track your baby's growth.  The fetal heartbeat will be listened to starting around week 10 or 12 of your pregnancy.  Test results from any previous visits will be discussed. Your health care provider may ask you:  How you are feeling.  If you are feeling the baby move.  If you have had any abnormal symptoms, such as leaking fluid, bleeding, severe headaches, or abdominal cramping.  If you are using any tobacco products, including  cigarettes, chewing tobacco, and electronic cigarettes.  If you have any questions. Other tests that may be performed during your first trimester include:  Blood tests to find your blood type and to check for the presence of any previous infections. They will also be used to check for low iron levels (anemia) and Rh antibodies. Later in the pregnancy, blood tests for diabetes will be done along with other tests if problems develop.  Urine tests to check for infections, diabetes, or protein in the urine.  An ultrasound to confirm the proper growth and development of the baby.  An amniocentesis to check for possible genetic problems.  Fetal screens for spina bifida and Down syndrome.  You may need other tests to make sure you and the baby are doing well.  HIV (human immunodeficiency virus) testing. Routine prenatal testing includes screening for HIV, unless you choose not to have this test. HOME CARE INSTRUCTIONS  Medicines  Follow your health care provider's instructions regarding medicine use. Specific medicines may be either safe or unsafe to take during pregnancy.  Take your prenatal vitamins as directed.  If you develop constipation, try taking a stool softener if your health care provider approves. Diet  Eat regular, well-balanced meals. Choose a variety of foods, such as meat or vegetable-based protein, fish, milk and low-fat dairy products, vegetables, fruits, and whole grain breads and cereals. Your health care provider will help you determine the amount of weight gain that is right for you.  Avoid raw meat and uncooked cheese. These carry germs that can cause birth defects in the baby.  Eating four or five small meals rather than three large meals a day may help relieve nausea and vomiting. If you start to feel nauseous, eating a few soda crackers can be helpful. Drinking liquids between meals instead of during meals also seems to help nausea and vomiting.  If you develop  constipation, eat more high-fiber foods, such as fresh vegetables or fruit and whole grains. Drink enough fluids to keep your urine clear or pale yellow. Activity and Exercise  Exercise only as directed by your health care provider. Exercising will help you:  Control your weight.  Stay in shape.  Be prepared for labor and delivery.  Experiencing pain or cramping in the lower abdomen or low back is a good sign that you should stop exercising. Check with your health care provider before continuing normal exercises.  Try to avoid standing for long periods of time. Move your legs often if you must stand in one place for a long time.  Avoid heavy lifting.  Wear low-heeled shoes, and practice good posture.  You may continue to have sex unless your health care provider directs you otherwise. Relief of Pain or Discomfort  Wear a good support bra for breast tenderness.   Take warm sitz baths to soothe any pain or  discomfort caused by hemorrhoids. Use hemorrhoid cream if your health care provider approves.   Rest with your legs elevated if you have leg cramps or low back pain.  If you develop varicose veins in your legs, wear support hose. Elevate your feet for 15 minutes, 3-4 times a day. Limit salt in your diet. Prenatal Care  Schedule your prenatal visits by the twelfth week of pregnancy. They are usually scheduled monthly at first, then more often in the last 2 months before delivery.  Write down your questions. Take them to your prenatal visits.  Keep all your prenatal visits as directed by your health care provider. Safety  Wear your seat belt at all times when driving.  Make a list of emergency phone numbers, including numbers for family, friends, the hospital, and police and fire departments. General Tips  Ask your health care provider for a referral to a local prenatal education class. Begin classes no later than at the beginning of month 6 of your pregnancy.  Ask for  help if you have counseling or nutritional needs during pregnancy. Your health care provider can offer advice or refer you to specialists for help with various needs.  Do not use hot tubs, steam rooms, or saunas.  Do not douche or use tampons or scented sanitary pads.  Do not cross your legs for long periods of time.  Avoid cat litter boxes and soil used by cats. These carry germs that can cause birth defects in the baby and possibly loss of the fetus by miscarriage or stillbirth.  Avoid all smoking, herbs, alcohol, and medicines not prescribed by your health care provider. Chemicals in these affect the formation and growth of the baby.  Do not use any tobacco products, including cigarettes, chewing tobacco, and electronic cigarettes. If you need help quitting, ask your health care provider. You may receive counseling support and other resources to help you quit.  Schedule a dentist appointment. At home, brush your teeth with a soft toothbrush and be gentle when you floss. SEEK MEDICAL CARE IF:   You have dizziness.  You have mild pelvic cramps, pelvic pressure, or nagging pain in the abdominal area.  You have persistent nausea, vomiting, or diarrhea.  You have a bad smelling vaginal discharge.  You have pain with urination.  You notice increased swelling in your face, hands, legs, or ankles. SEEK IMMEDIATE MEDICAL CARE IF:   You have a fever.  You are leaking fluid from your vagina.  You have spotting or bleeding from your vagina.  You have severe abdominal cramping or pain.  You have rapid weight gain or loss.  You vomit blood or material that looks like coffee grounds.  You are exposed to [place] measles and have never had them.  You are exposed to fifth disease or chickenpox.  You develop a severe headache.  You have shortness of breath.  You have any kind of trauma, such as from a fall or a car accident.   This information is not intended to replace advice  given to you by your health care provider. Make sure you discuss any questions you have with your health care provider.   Document Released: 11/16/2001 Document Revised: 12/13/2014 Document Reviewed: 10/02/2013 Elsevier Interactive Patient Education [employer]2016 [employer]

## 2016-10-05 NOTE — MAU Provider Note (Signed)
History     CSN: [id number]  Arrival date and time: 10/05/16 1020   First Provider Initiated Contact with Patient 10/05/16 1117      Chief Complaint  Patient presents with  . Abdominal Cramping   HPI Ariel Turner is a 25 y.o. G2P1001 at [redacted]w[redacted]d who presents with abdominal pain. Reports intermittent lower abdominal cramping since Sunday. Last felts pain this morning, it lasted 5 minutes. Describes as cramp like pain & rates 3/10. Has not treated pain. Reports some thick white vaginal discharge; no irritation. Denies n/v/d, constipation, dysuria, or vaginal bleeding.   OB History    Gravida Para Term Preterm AB Living   2 1 1     1    SAB TAB Ectopic Multiple Live Births           1      Past Medical History:  Diagnosis Date  . Anemia   . Heart murmur    during infancy  . Infection    UTI  . Pregnancy induced hypertension     Past Surgical History:  Procedure Laterality Date  . NO PAST SURGERIES      Family History  Problem Relation Age of Onset  . Diabetes Other   . Hypertension Other   . Thyroid disease Mother   . Diabetes Maternal Grandmother   . Hypertension Maternal Grandmother   . Cancer Maternal Grandfather     lung    Social History  Substance Use Topics  . Smoking status: Former Games developer  . Smokeless tobacco: Never Used     Comment: quit prior to 1st preg  . Alcohol use No    Allergies: No Known Allergies  Prescriptions Prior to Admission  Medication Sig Dispense Refill Last Dose  . Prenatal Vit-Fe Fumarate-FA (PRENATAL MULTIVITAMIN) TABS tablet Take 1 tablet by mouth daily at 12 noon.   Taking    Review of Systems  Constitutional: Negative.   Gastrointestinal: Positive for abdominal pain (not currently). Negative for constipation, diarrhea, nausea and vomiting.  Genitourinary: Negative for dysuria.       + vaginal discharge No vaginal bleeding   Physical Exam   Blood pressure 132/85, pulse 78, temperature 98.1 F (36.7 C),  temperature source Oral, resp. rate 16, height 5' 2.5" (1.588 m), weight 193 lb (87.5 kg), last menstrual period 08/18/2016, currently breastfeeding.  Physical Exam  Nursing note and vitals reviewed. Constitutional: She is oriented to person, place, and time. She appears well-developed and well-nourished. No distress.  HENT:  Head: Normocephalic and atraumatic.  Eyes: Conjunctivae are normal. Right eye exhibits no discharge. Left eye exhibits no discharge. No scleral icterus.  Neck: Normal range of motion.  Cardiovascular: Normal rate, regular rhythm and normal heart sounds.   No murmur heard. Respiratory: Effort normal and breath sounds normal. No respiratory distress. She has no wheezes.  GI: Soft. Bowel sounds are normal. She exhibits no distension. There is no tenderness. There is no rebound.  Genitourinary: Uterus normal. Cervix exhibits no motion tenderness.  Genitourinary Comments: Cervix closed  Neurological: She is alert and oriented to person, place, and time.  Skin: Skin is warm and dry. She is not diaphoretic.  Psychiatric: She has a normal mood and affect. Her behavior is normal. Judgment and thought content normal.    MAU Course  Procedures Results for orders placed or performed during the hospital encounter of 10/05/16 (from the past 24 hour(s))  Urinalysis, Routine w reflex microscopic (not at [facility])     Status: Abnormal  Collection Time: 10/05/16 10:45 AM  Result Value Ref Range   Color, Urine YELLOW YELLOW   APPearance HAZY (A) CLEAR   Specific Gravity, Urine 1.025 1.005 - 1.030   pH 6.0 5.0 - 8.0   Glucose, UA NEGATIVE NEGATIVE mg/dL   Hgb urine dipstick TRACE (A) NEGATIVE   Bilirubin Urine NEGATIVE NEGATIVE   Ketones, ur NEGATIVE NEGATIVE mg/dL   Protein, ur NEGATIVE NEGATIVE mg/dL   Nitrite NEGATIVE NEGATIVE   Leukocytes, UA MODERATE (A) NEGATIVE  Urine microscopic-add on     Status: Abnormal   Collection Time: 10/05/16 10:45 AM  Result Value Ref Range    Squamous Epithelial / LPF 6-30 (A) NONE SEEN   WBC, UA 6-30 0 - 5 WBC/hpf   RBC / HPF 0-5 0 - 5 RBC/hpf   Bacteria, UA FEW (A) NONE SEEN  CBC     Status: Abnormal   Collection Time: 10/05/16 11:38 AM  Result Value Ref Range   WBC 5.3 4.0 - 10.5 K/uL   RBC 4.85 3.87 - 5.11 MIL/uL   Hemoglobin 10.6 (L) 12.0 - 15.0 g/dL   HCT 16.133.3 (L) 09.636.0 - 04.546.0 %   MCV 68.7 (L) 78.0 - 100.0 fL   MCH 21.9 (L) 26.0 - 34.0 pg   MCHC 31.8 30.0 - 36.0 g/dL   RDW 40.917.7 (H) 81.111.5 - 91.415.5 %   Platelets 310 150 - 400 K/uL  hCG, quantitative, pregnancy     Status: Abnormal   Collection Time: 10/05/16 11:38 AM  Result Value Ref Range   hCG, Beta Chain, Quant, S 18,230 (H) <5 mIU/mL  Wet prep, genital     Status: Abnormal   Collection Time: 10/05/16 11:38 AM  Result Value Ref Range   Yeast Wet Prep HPF POC NONE SEEN NONE SEEN   Trich, Wet Prep NONE SEEN NONE SEEN   Clue Cells Wet Prep HPF POC NONE SEEN NONE SEEN   WBC, Wet Prep HPF POC FEW (A) NONE SEEN   Sperm NONE SEEN    [place] Ob Comp Less 14 Wks  Result Date: 10/05/2016 CLINICAL DATA:  Left lower quadrant pain and cramping for 4 days. Gestational age by LMP of 6 weeks 6 days. EXAM: OBSTETRIC <14 WK US AND TRANSVAGINAL OB US TECHNIQUE: Both transabdominal and transvaginal ultrasound examinations were performed for complete evaluation of the gestation as well as the maternal uterus, adnexal regions, and pelvic cul-de-sac. Transvaginal technique was performed to assess early pregnancy. COMPARISON:  None. FINDINGS: Intrauterine gestational sac: Single Yolk sac:  Visualized. Embryo:  Visualized. Cardiac Activity: Visualized. Heart Rate: 123  bpm CRL:  6  mm   6 w   2 d                  US EDC: 05/29/2017 Subchorionic hemorrhage:  Moderate subchorionic hemorrhage is noted. Maternal uterus/adnexae: Both ovaries are normal in appearance. Small right ovarian corpus luteum noted. No mass or free fluid identified. IMPRESSION: Single living IUP measuring 6 weeks 2 days, with US  EDC of 05/29/2017. This is concordant with LMP. Moderate subchorionic hemorrhage. Electronically Signed   By: Myles RosenthalJohn  Stahl M.D.   On: 10/05/2016 12:36   [place] Ob Transvaginal  Result Date: 10/05/2016 CLINICAL DATA:  Left lower quadrant pain and cramping for 4 days. Gestational age by LMP of 6 weeks 6 days. EXAM: OBSTETRIC <14 WK US AND TRANSVAGINAL OB US TECHNIQUE: Both transabdominal and transvaginal ultrasound examinations were performed for complete evaluation of the gestation as well as  the maternal uterus, adnexal regions, and pelvic cul-de-sac. Transvaginal technique was performed to assess early pregnancy. COMPARISON:  None. FINDINGS: Intrauterine gestational sac: Single Yolk sac:  Visualized. Embryo:  Visualized. Cardiac Activity: Visualized. Heart Rate: 123  bpm CRL:  6  mm   6 w   2 d                  US EDC: 05/29/2017 Subchorionic hemorrhage:  Moderate subchorionic hemorrhage is noted. Maternal uterus/adnexae: Both ovaries are normal in appearance. Small right ovarian corpus luteum noted. No mass or free fluid identified. IMPRESSION: Single living IUP measuring 6 weeks 2 days, with US EDC of 05/29/2017. This is concordant with LMP. Moderate subchorionic hemorrhage. Electronically Signed   By: Myles RosenthalJohn  Stahl M.D.   On: 10/05/2016 12:36    MDM +UPT UA, wet prep, GC/chlamydia, CBC, ABO/Rh, quant hCG, HIV, and US today to rule out ectopic pregnancy Ultrasound shows SIUP measuring [redacted]w[redacted]d with cardiac activity; moderate SCH A positive Assessment and Plan  A: 1. Normal IUP (intrauterine pregnancy) on prenatal ultrasound, first trimester   2. Abdominal pain during pregnancy   3. Subchorionic hematoma in first trimester, single or unspecified fetus    P: Discharge home GC/CT pending Schedule prenatal care with [facility] Discussed reasons to return to MAU   Judeth HornErin Arul Farabee 10/05/2016, 11:16 AM

## 2016-10-05 NOTE — Assessment & Plan Note (Addendum)
Urine pregnancy test positive. G2P1001 at estimated 8174w6d by LMP (though irregular periods). This is a desired pregnancy. - Will need dating ultrasound. Also want to ensure IUP given cramping several days ago. [facility], but the soonest they had was next Monday. Although description of pain not "typical" for ectopic, I would rather confirm IUP sooner than waiting an entire week to rule out ectopic. Discussed options for evaluation with patient, and after discussion [facility] 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

## 2016-10-05 NOTE — Discharge Instructions (Signed)
Abdominal Pain During Pregnancy Belly (abdominal) pain is common during pregnancy. Most of the time, it is not a serious problem. Other times, it can be a sign that something is wrong with the pregnancy. Always tell your doctor if you have belly pain. HOME CARE Monitor your belly pain for any changes. The following actions may help you feel better:  Do not have sex (intercourse) or put anything in your vagina until you feel better.  Rest until your pain stops.  Drink clear fluids if you feel sick to your stomach (nauseous). Do not eat solid food until you feel better.  Only take medicine as told by your doctor.  Keep all doctor visits as told. GET HELP RIGHT AWAY IF:   You are bleeding, leaking fluid, or pieces of tissue come out of your vagina.  You have more pain or cramping.  You keep throwing up (vomiting).  You have pain when you pee (urinate) or have blood in your pee.  You have a fever.  You do not feel your baby moving as much.  You feel very weak or feel like passing out.  You have trouble breathing, with or without belly pain.  You have a very bad headache and belly pain.  You have fluid leaking from your vagina and belly pain.  You keep having watery poop (diarrhea).  Your belly pain does not go away after resting, or the pain gets worse. MAKE SURE YOU:   Understand these instructions.  Will watch your condition.  Will get help right away if you are not doing well or get worse.   This information is not intended to replace advice given to you by your health care provider. Make sure you discuss any questions you have with your health care provider.   Document Released: 11/10/2009 Document Revised: 07/25/2013 Document Reviewed: 06/21/2013 Elsevier Interactive Patient Education 2016 Elsevier Inc.  Subchorionic Hematoma A subchorionic hematoma is a gathering of blood between the outer wall of the placenta and the inner wall of the womb (uterus). The  placenta is the organ that connects the fetus to the wall of the uterus. The placenta performs the feeding, breathing (oxygen to the fetus), and waste removal (excretory work) of the fetus.  Subchorionic hematoma is the most common abnormality found on a result from ultrasonography done during the first trimester or early second trimester of pregnancy. If there has been little or no vaginal bleeding, early small hematomas usually shrink on their own and do not affect your baby or pregnancy. The blood is gradually absorbed over 1-2 weeks. When bleeding starts later in pregnancy or the hematoma is larger or occurs in an older pregnant woman, the outcome may not be as good. Larger hematomas may get bigger, which increases the chances for miscarriage. Subchorionic hematoma also increases the risk of premature detachment of the placenta from the uterus, preterm (premature) labor, and stillbirth. HOME CARE INSTRUCTIONS  Stay on bed rest if your health care provider recommends this. Although bed rest will not prevent more bleeding or prevent a miscarriage, your health care provider may recommend bed rest until you are advised otherwise.  Avoid heavy lifting (more than 10 lb [4.5 kg]), exercise, sexual intercourse, or douching as directed by your health care provider.  Keep track of the number of pads you use each day and how soaked (saturated) they are. Write down this information.  Do not use tampons.  Keep all follow-up appointments as directed by your health care provider. Your health  care provider may ask you to have follow-up blood tests or ultrasound tests or both. SEEK IMMEDIATE MEDICAL CARE IF:  You have severe cramps in your stomach, back, abdomen, or pelvis.  You have a fever.  You pass large clots or tissue. Save any tissue for your health care provider to look at.  Your bleeding increases or you become lightheaded, feel weak, or have fainting episodes.   This information is not intended  to replace advice given to you by your health care provider. Make sure you discuss any questions you have with your health care provider.   Document Released: 03/09/2007 Document Revised: 12/13/2014 Document Reviewed: 06/21/2013 Elsevier Interactive Patient Education [employer]2016 Elsevier Inc.
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
## 2016-10-05 NOTE — MAU Note (Signed)
Has been having abd cramping since Sunday.  Had dr's appt this morning and was told to come over here.  It doesn't feel like period cramps.  One occurrence this morning.  Pain is in LLQ.

## 2016-10-06 LAB — GC/CHLAMYDIA PROBE AMP (~~LOC~~) NOT AT ARMC
Chlamydia: NEGATIVE
Neisseria Gonorrhea: NEGATIVE

## 2016-10-06 LAB — HIV ANTIBODY (ROUTINE TESTING W REFLEX): HIV SCREEN 4TH GENERATION: NONREACTIVE

## 2016-12-06 NOTE — L&D Delivery Note (Addendum)
26 y.o. G2P1001 at 5580w6d came in for IOL for preeclampsia, developed severe range BPs and started on magnesium a few hours before delivery for severe preeclampsia. Patient had one PO cytotec, started on pitocin, SROM, was complete dilation at 4:45AM, delivered a viable female infant in cephalic, LOA position. No nuchal cord. Right anterior shoulder delivered with ease. 60 sec delayed cord clamping. Cord clamped x2 and cut. Placenta delivered spontaneously intact, with 3VC. Fundus firm on exam with massage and pitocin. Good hemostasis noted.  Anesthesia: None Laceration: None Suture: N/A Good hemostasis noted. EBL: 200cc  Mom and baby recovering in LDR.    Apgars: APGAR (1 MIN): 9   APGAR (5 MINS): 9    Weight: Pending skin to skin  Sponge and instrument count were correct x2. Placenta sent to L&D.  Will continue magnesium drip for 24 hours postpartum.  Jen MowElizabeth Chenee Munns, DO [facility] Fellow [facility] 05/31/2017, 5:12 AM

## 2016-12-20 ENCOUNTER — Ambulatory Visit (HOSPITAL_COMMUNITY)
Admission: RE | Admit: 2016-12-20 | Discharge: 2016-12-20 | Disposition: A | Discharge status: Home / Self Care | Payer: BLUE CROSS/BLUE SHIELD | Source: Ambulatory Visit | Attending: Family Medicine | Admitting: Family Medicine

## 2016-12-20 ENCOUNTER — Encounter: Payer: Self-pay | Admitting: Family Medicine

## 2016-12-20 ENCOUNTER — Ambulatory Visit (INDEPENDENT_AMBULATORY_CARE_PROVIDER_SITE_OTHER): Payer: BLUE CROSS/BLUE SHIELD | Admitting: Family Medicine

## 2016-12-20 VITALS — BP 110/82 | HR 86 | Temp 98.3°F | Ht 62.5 in | Wt 177.0 lb

## 2016-12-20 DIAGNOSIS — R0600 Dyspnea, unspecified: Secondary | ICD-10-CM

## 2016-12-20 DIAGNOSIS — R0602 Shortness of breath: Secondary | ICD-10-CM

## 2016-12-20 DIAGNOSIS — Z3482 Encounter for supervision of other normal pregnancy, second trimester: Secondary | ICD-10-CM | POA: Diagnosis not present

## 2016-12-20 LAB — HIV ANTIBODY (ROUTINE TESTING W REFLEX): HIV 1&2 Ab, 4th Generation: NONREACTIVE

## 2016-12-20 LAB — TSH: TSH: 2.51 mIU/L

## 2016-12-20 NOTE — Progress Notes (Signed)
Date of Visit: 12/20/2016   HPI:  Ariel Turner is a 26 y.o. G2P1001 at 4686w5d (dated via LMP = 3955w2d u/s) who presents to discuss shortness of breath. [facility], but has not yet scheduled a new OB visit or had new OB labs. Has not gotten set up with [facility] because she has actually gotten insurance through her job.  Shortness of breath began about 1.5 weeks ago. Felt out of breath and like she was going to pass out. Shortness of breath comes and goes. Happens mainly when she is standing or goes from sitting to standing up. Improves with resting and lying down. Is eating and drinking well. Drinks 3-4 bottles of water per day. No swelling. Has felt like heart was racing/skipping beats at times with the shortness of breath. Believes this shortness of breath is excessive for pregnancy. Feels nauseated in the mornings which improves after eating. Vomits most mornings once though this is overall greatly improved during the pregnancy.  Denies cramping/ctx, fluid leaking, vaginal bleeding, or decreased fetal movement. Has felt baby move (though only 6586w5d), just occasional little flutters. She did go to have a non-medical ultrasound done to determine the baby's gender, and was told she is having a boy.   ROS: See HPI.  PMFSH: history of LSIL on prior pap, anemia in pregnancy  PHYSICAL EXAM: BP 110/82   Pulse 86   Temp 98.3 F (36.8 C) (Oral)   Ht 5' 2.5" (1.588 m)   Wt 177 lb (80.3 kg)   LMP 08/18/2016   SpO2 98%   BMI 31.86 kg/m  Gen: NAD, pleasant, cooperative, well appearing HEENT: normocephalic, atraumatic, moist mucous membranes  Heart: regular rate and rhythm, no murmur Lungs: clear to auscultation bilaterally, normal work of breathing, no wheezes or crackles, occasional cough, speaks in full sentences and ambulates without difficulty Neuro: alert, grossly nonfocal, speech normal Ext: No appreciable lower extremity  edema bilaterally. Negative homan's bilaterally. No calf tenderness or erythema Abdomen: soft, nontender to palpation.   Fundal height 17cm FHR 140bpm  ASSESSMENT/PLAN:  Encounter for supervision of other normal pregnancy [facility]. Initial OB labs obtained today - obstetric panel, HIV. Checking TSH as well due to shortness of breath (see separate assessment) Will need to collect OB urine culture & UA at new OB visit, along with pap smear. Instructed patient to schedule new OB visit ASAP, as she is well into second trimester and we need to schedule anatomy scan but need to do new OB visit first.  Shortness of breath Unclear etiology. Differential diagnosis is broad and includes physiologic dyspnea of pregnancy, anemia, GERD, cardiac arrhythmia/valvular issue, VTE/PE, asthma, etc. Exam today is normal, with normal work of breathing, clear lungs, no edema. Orthostatics negative. EKG normal and unchanged from prior. Vitals are entirely normal, with ambulatory O2 sat 97% and above. Strongly doubt PE at this time given normal oxygen saturation, no tachycardia, no findings of DVT on exam, and no chest pain. Patient is well appearing. On review of prior records, she had similar complaints during her first pregnancy which was deemed to likely be due just to pregnancy physiology. It was mentioned at that time that patient had a history of arrhythmia as a child. Plan: - As patient endorses palpitations around times of shortness of breath and has vague history of arrhythmia as a child, reasonable to have her evaluated  by cardiology. Suspect they may elect to do an event monitor. Will refer to Dr. Tobias Alexander. -Will check labs today: CBC (included in obstetric panel) and TSH to further elucidate cause of shortness of breath.  -Trial of over the counter tums in case GERD is contributing, especially with some nausea and  cough -Discussed with on-call OB/GYN attending Dr. Erin Fulling, who agrees with CBC, TSH, and cardiology referral. -follow up with me for new OB visit to establish prenatal care ASAP  FOLLOW UP: Follow up ASAP for new OB visit Referring to cardiology  [place] J. Pollie Meyer, MD [facility]

## 2016-12-20 NOTE — Assessment & Plan Note (Addendum)
Plans to [facility] this [facility]. Initial OB labs obtained today - obstetric panel, HIV. Checking TSH as well due to shortness of breath (see separate assessment) Will need to collect OB urine culture & UA at new OB visit, along with pap smear. Instructed patient to schedule new OB visit ASAP, as she is well into second trimester and we need to schedule anatomy scan but need to do new OB visit first.

## 2016-12-20 NOTE — Assessment & Plan Note (Signed)
Unclear etiology. Differential diagnosis is broad and includes physiologic dyspnea of pregnancy, anemia, GERD, cardiac arrhythmia/valvular issue, VTE/PE, asthma, etc. Exam today is normal, with normal work of breathing, clear lungs, no edema. Orthostatics negative. EKG normal and unchanged from prior. Vitals are entirely normal, with ambulatory O2 sat 97% and above. Strongly doubt PE at this time given normal oxygen saturation, no tachycardia, no findings of DVT on exam, and no chest pain. Patient is well appearing. On review of prior records, she had similar complaints during her first pregnancy which was deemed to likely be due just to pregnancy physiology. It was mentioned at that time that patient had a history of arrhythmia as a child. Plan: - As patient endorses palpitations around times of shortness of breath and has vague history of arrhythmia as a child, reasonable to have her evaluated by cardiology. Suspect they may elect to do an event monitor. Will refer to Dr. Tobias AlexanderKatarina Nelson. -Will check labs today: CBC (included in obstetric panel) and TSH to further elucidate cause of shortness of breath.  -Trial of over the counter tums in case GERD is contributing, especially with some nausea and cough -Discussed with on-call OB/GYN attending Dr. Erin FullingHarraway-Smith, who agrees with CBC, TSH, and cardiology referral. -follow up with me for new OB visit to establish prenatal care ASAP

## 2016-12-20 NOTE — Patient Instructions (Addendum)
Referring to a cardiologist to get checked out - someone will call you with that appointment   Bloodwork today.   Schedule new OB visit with me to get started on prenatal care.  If you have any cramping/contractions, vaginal bleeding, fluid leaking, or are worried that baby is not moving normally, [facility].   If chest pain, or shortness of breath worsens, or swelling please go to the ER.  Be well, Dr. Pollie MeyerMcIntyre

## 2016-12-21 LAB — OBSTETRIC PANEL
Antibody Screen: NEGATIVE
BASOS ABS: 0 {cells}/uL (ref 0–200)
Basophils Relative: 0 %
EOS PCT: 2 %
Eosinophils Absolute: 198 cells/uL (ref 15–500)
HEMATOCRIT: 34.7 % — AB (ref 35.0–45.0)
HEP B S AG: NEGATIVE
Hemoglobin: 11 g/dL — ABNORMAL LOW (ref 11.7–15.5)
LYMPHS ABS: 1584 {cells}/uL (ref 850–3900)
Lymphocytes Relative: 16 %
MCH: 23.3 pg — ABNORMAL LOW (ref 27.0–33.0)
MCHC: 31.7 g/dL — AB (ref 32.0–36.0)
MCV: 73.4 fL — AB (ref 80.0–100.0)
MONO ABS: 495 {cells}/uL (ref 200–950)
MPV: 9.7 fL (ref 7.5–12.5)
Monocytes Relative: 5 %
NEUTROS ABS: 7623 {cells}/uL (ref 1500–7800)
Neutrophils Relative %: 77 %
Platelets: 288 10*3/uL (ref 140–400)
RBC: 4.73 MIL/uL (ref 3.80–5.10)
RDW: 18.4 % — ABNORMAL HIGH (ref 11.0–15.0)
Rh Type: POSITIVE
Rubella: 1.36 Index — ABNORMAL HIGH (ref <0.90)
WBC: 9.9 10*3/uL (ref 3.8–10.8)

## 2017-01-12 ENCOUNTER — Encounter: Payer: Self-pay | Admitting: Family Medicine

## 2017-02-08 ENCOUNTER — Telehealth: Payer: Self-pay | Admitting: Family Medicine

## 2017-02-08 DIAGNOSIS — Z3482 Encounter for supervision of other normal pregnancy, second trimester: Secondary | ICD-10-CM

## 2017-02-08 NOTE — Telephone Encounter (Signed)
Pt would like to know she is going to have her first u/s. Pt has an appointment with Dr. Pollie MeyerMcIntyre on 02-14-17. ep

## 2017-02-08 NOTE — Telephone Encounter (Signed)
I will put an order in and we can schedule the anatomy ultrasound. I did notice that her appointment is in a regular clinic spot. It is a NEW OB visit and should be an hour. She may need to be moved to a different day.  Red team, can you change the appointment time and also schedule her for anatomy u/s?  Thanks Latrelle DodrillBrittany J Tex Conroy, MD

## 2017-02-10 NOTE — Telephone Encounter (Signed)
Do I have anything where we could take up two slots for her visit (rather than a full hour)? Two should be adequate. Let me know, thanks. Latrelle DodrillBrittany J Levin Dagostino, MD

## 2017-02-10 NOTE — Telephone Encounter (Signed)
You dont have any hour long slots until mid April. Is it ok to schedule her NOB with anyone?

## 2017-02-11 NOTE — Telephone Encounter (Signed)
No you have nothing until mid April, but it looks like they have given you 30 minutes for her appointment on 3/12.

## 2017-02-14 ENCOUNTER — Encounter: Payer: Self-pay | Admitting: Family Medicine

## 2017-02-14 ENCOUNTER — Ambulatory Visit (INDEPENDENT_AMBULATORY_CARE_PROVIDER_SITE_OTHER): Payer: BLUE CROSS/BLUE SHIELD | Admitting: Family Medicine

## 2017-02-14 ENCOUNTER — Other Ambulatory Visit (HOSPITAL_COMMUNITY)
Admission: RE | Admit: 2017-02-14 | Discharge: 2017-02-14 | Disposition: A | Discharge status: Home / Self Care | Payer: BLUE CROSS/BLUE SHIELD | Source: Ambulatory Visit | Attending: Family Medicine | Admitting: Family Medicine

## 2017-02-14 VITALS — BP 122/84 | HR 86 | Temp 97.8°F | Wt 187.2 lb

## 2017-02-14 DIAGNOSIS — Z3482 Encounter for supervision of other normal pregnancy, second trimester: Secondary | ICD-10-CM

## 2017-02-14 DIAGNOSIS — Z01419 Encounter for gynecological examination (general) (routine) without abnormal findings: Secondary | ICD-10-CM | POA: Diagnosis not present

## 2017-02-14 DIAGNOSIS — N841 Polyp of cervix uteri: Secondary | ICD-10-CM

## 2017-02-14 DIAGNOSIS — Z113 Encounter for screening for infections with a predominantly sexual mode of transmission: Secondary | ICD-10-CM | POA: Insufficient documentation

## 2017-02-14 LAB — POCT URINALYSIS DIP (MANUAL ENTRY)
BILIRUBIN UA: NEGATIVE
BILIRUBIN UA: NEGATIVE
GLUCOSE UA: NEGATIVE
Nitrite, UA: NEGATIVE
PROTEIN UA: NEGATIVE
Spec Grav, UA: 1.025
Urobilinogen, UA: 0.2
pH, UA: 6.5

## 2017-02-14 LAB — POCT UA - MICROSCOPIC ONLY

## 2017-02-14 LAB — POCT 1 HR PRENATAL GLUCOSE: Glucose 1 Hr Prenatal, POC: 140 mg/dL

## 2017-02-14 NOTE — Progress Notes (Signed)
Ariel Turner is a 26 y.o. yo G2P1001 at 7133w5d who presents for her initial prenatal visit.  She is taking PNV. Denies cramping/ctx, fluid leaking, vaginal bleeding, or decreased fetal movement.    Earlier this pregnancy had MAU visit to confirm IUP, with ultrasound at 1049w6d by LMP showing IUP with concordant dating to LMP. Did have moderate subchorionic hemorrhage at that time.  Also had visit at which she complained of shortness of breath, lightheadedness and palpitations back in mid January. At that time, labs were obtained which were normal, also was referred to cardiology (though patient never went to appointment). Now, she reports shortness of breath has greatly improved. Only notices it now if she overeats. No palpitations or chest pain. She prefers to hold off on cardiology visit for now.  OB/Medical/Surgical/Family History: - OB: one prior SVD in 2014 at 3643w4d, complicated by gestational hypertension and mild anemia - Medical: pregnancy induced hypertension, heart murmur, anemia - Surgical: none - Family: mom with thyroid disease, dad hypertension, MGM diabetes & hypertension, MGF lung cancer  FHR 141bpm Uterine height 24cm  Prenatal Exam: Gen: Well nourished, well developed.  No distress.  Vitals noted. HEENT: Normocephalic, atraumatic.  Neck supple without cervical lymphadenopathy, thyromegaly or thyroid nodules.  Fair dentition. CV: RRR no murmur, gallops or rubs Lungs: CTAB.  Normal respiratory effort without wheezes or rales. Abd: soft, NTND. +BS.  Uterus appropriate for gestational age. GU: Normal external female genitalia without lesions.  Normal vaginal, well rugated without lesions. Normal leukorrhea of pregnancy. Pea-sized cervical polyp noted at 3oclock position. Bimanual exam: No adnexal mass or TTP. No CMT. cervix high but feels closed.  Uterus size 24cm Ext: No clubbing, cyanosis or edema. Psych: Normal grooming and dress.  Not depressed or anxious appearing.   Normal thought content and process without flight of ideas or looseness of associations.  Assessment & Plan:  26 y.o. yo G2P1001 at 633w5d via LMP=5049w6d u/s.  1. Routine pregnancy care: - late to care for initial OB visit (though did have other non-OB dedicated visits earlier in pregnancy) - dating is reliable. - anatomy scan scheduled - 1 hour GTT done today and was elevated. 3hr GTT will be scheduled. - collected UA & OB urine culture today for initial labs (not previously obtained) - pap smear done today - see below - genetic screening declined by patient (fortunate, as she is too late for it anyways) - prenatal labs reviewed, unremarkable - PHQ-9 and Pregnancy Medical Home forms completed and reviewed - no concerns.  2. Cervical polyp: noted today on exam. Patient has history of LSIL in 2014, with subsequent normal pap in 2016 - was due for follow up pap 1 year later but just now presenting for this.  - Routine follow up pap obtained today, attempted to get good sample from area around polyp.  - Discussed 02/16/17 on phone with Dr. Jolayne Pantheronstant (OB/GYN on call) to confirm that nothing more is necessary. As long as pap is normal, no need for any intervention or colposcopy this pregnancy. Cervix should be re-examined postpartum. Should NOT attempt to biopsy or remove polyp during pregnancy.  3. Prior shortness of breath/palpitations - much improved. Prior labwork was unremarkable. Had referred to cardiology, but patient prefers to hold on this now given that her symptoms are better. - continue to monitor  4. Microscopic hematuria - noted on UA today. Will repeat at next visit. OB urine culture pending.  Bleeding, labor, and fetal movement precautions reviewed. Follow up in  3 weeks.  Levert Feinstein, MD [facility]

## 2017-02-14 NOTE — Patient Instructions (Addendum)
If you have any cramping/contractions, vaginal bleeding, fluid leaking, or are worried that baby is not moving normally, [facility] to [facility].   1 hour diabetes test today Pap smear & urine testing done today  Next visit in 3 weeks.  Be well, Dr. Pollie MeyerMcIntyre    Second Trimester of Pregnancy The second trimester is from week 14 through week 27 (months 4 through 6). The second trimester is often a time when you feel your best. Your body has adjusted to being pregnant, and you begin to feel better physically. Usually, morning sickness has lessened or quit completely, you may have more energy, and you may have an increase in appetite. The second trimester is also a time when the fetus is growing rapidly. At the end of the sixth month, the fetus is about 9 inches long and weighs about 1 pounds. You will likely begin to feel the baby move (quickening) between 16 and 20 weeks of pregnancy. Body changes during your second trimester Your body continues to go through many changes during your second trimester. The changes vary from woman to woman.  Your weight will continue to increase. You will notice your lower abdomen bulging out.  You may begin to get stretch marks on your hips, abdomen, and breasts.  You may develop headaches that can be relieved by medicines. The medicines should be approved by your health care provider.  You may urinate more often because the fetus is pressing on your bladder.  You may develop or continue to have heartburn as a result of your pregnancy.  You may develop constipation because certain hormones are causing the muscles that push waste through your intestines to slow down.  You may develop hemorrhoids or swollen, bulging veins (varicose veins).  You may have back pain. This is caused by:  Weight gain.  Pregnancy hormones that are relaxing the joints in your pelvis.  A shift in weight and the muscles that support your  balance.  Your breasts will continue to grow and they will continue to become tender.  Your gums may bleed and may be sensitive to brushing and flossing.  Dark spots or blotches (chloasma, mask of pregnancy) may develop on your face. This will likely fade after the baby is born.  A dark line from your belly button to the pubic area (linea nigra) may appear. This will likely fade after the baby is born.  You may have changes in your hair. These can include thickening of your hair, rapid growth, and changes in texture. Some women also have hair loss during or after pregnancy, or hair that feels dry or thin. Your hair will most likely return to normal after your baby is born. What to expect at prenatal visits During a routine prenatal visit:  You will be weighed to make sure you and the fetus are growing normally.  Your blood pressure will be taken.  Your abdomen will be measured to track your baby's growth.  The fetal heartbeat will be listened to.  Any test results from the previous visit will be discussed. Your health care provider may ask you:  How you are feeling.  If you are feeling the baby move.  If you have had any abnormal symptoms, such as leaking fluid, bleeding, severe headaches, or abdominal cramping.  If you are using any tobacco products, including cigarettes, chewing tobacco, and electronic cigarettes.  If you have any questions. Other tests that may be performed during your second trimester include:  Blood tests that check for:  Low iron levels (anemia).  High blood sugar that affects pregnant women (gestational diabetes) between 52 and 28 weeks.  Rh antibodies. This is to check for a protein on red blood cells (Rh factor).  Urine tests to check for infections, diabetes, or protein in the urine.  An ultrasound to confirm the proper growth and development of the baby.  An amniocentesis to check for possible genetic problems.  Fetal screens for spina  bifida and Down syndrome.  HIV (human immunodeficiency virus) testing. Routine prenatal testing includes screening for HIV, unless you choose not to have this test. Follow these instructions at home: Medicines   Follow your health care provider's instructions regarding medicine use. Specific medicines may be either safe or unsafe to take during pregnancy.  Take a prenatal vitamin that contains at least 600 micrograms (mcg) of folic acid.  If you develop constipation, try taking a stool softener if your health care provider approves. Eating and drinking   Eat a balanced diet that includes fresh fruits and vegetables, whole grains, good sources of protein such as meat, eggs, or tofu, and low-fat dairy. Your health care provider will help you determine the amount of weight gain that is right for you.  Avoid raw meat and uncooked cheese. These carry germs that can cause birth defects in the baby.  If you have low calcium intake from food, talk to your health care provider about whether you should take a daily calcium supplement.  Limit foods that are high in fat and processed sugars, such as fried and sweet foods.  To prevent constipation:  Drink enough fluid to keep your urine clear or pale yellow.  Eat foods that are high in fiber, such as fresh fruits and vegetables, whole grains, and beans. Activity   Exercise only as directed by your health care provider. Most women can continue their usual exercise routine during pregnancy. Try to exercise for 30 minutes at least 5 days a week. Stop exercising if you experience uterine contractions.  Avoid heavy lifting, wear low heel shoes, and practice good posture.  A sexual relationship may be continued unless your health care provider directs you otherwise. Relieving pain and discomfort   Wear a good support bra to prevent discomfort from breast tenderness.  Take warm sitz baths to soothe any pain or discomfort caused by hemorrhoids. Use  hemorrhoid cream if your health care provider approves.  Rest with your legs elevated if you have leg cramps or low back pain.  If you develop varicose veins, wear support hose. Elevate your feet for 15 minutes, 3-4 times a day. Limit salt in your diet. Prenatal Care   Write down your questions. Take them to your prenatal visits.  Keep all your prenatal visits as told by your health care provider. This is important. Safety   Wear your seat belt at all times when driving.  Make a list of emergency phone numbers, including numbers for family, friends, the hospital, and police and fire departments. General instructions   Ask your health care provider for a referral to a local prenatal education class. Begin classes no later than the beginning of month 6 of your pregnancy.  Ask for help if you have counseling or nutritional needs during pregnancy. Your health care provider can offer advice or refer you to specialists for help with various needs.  Do not use hot tubs, steam rooms, or saunas.  Do not douche or use tampons or scented sanitary pads.  Do not cross your legs for long periods of time.  Avoid cat litter boxes and soil used by cats. These carry germs that can cause birth defects in the baby and possibly loss of the fetus by miscarriage or stillbirth.  Avoid all smoking, herbs, alcohol, and unprescribed drugs. Chemicals in these products can affect the formation and growth of the baby.  Do not use any products that contain nicotine or tobacco, such as cigarettes and e-cigarettes. If you need help quitting, ask your health care provider.  Visit your dentist if you have not gone yet during your pregnancy. Use a soft toothbrush to brush your teeth and be gentle when you floss. Contact a health care provider if:  You have dizziness.  You have mild pelvic cramps, pelvic pressure, or nagging pain in the abdominal area.  You have persistent nausea, vomiting, or diarrhea.  You  have a bad smelling vaginal discharge.  You have pain when you urinate. Get help right away if:  You have a fever.  You are leaking fluid from your vagina.  You have spotting or bleeding from your vagina.  You have severe abdominal cramping or pain.  You have rapid weight gain or weight loss.  You have shortness of breath with chest pain.  You notice sudden or extreme swelling of your face, hands, ankles, feet, or legs.  You have not felt your baby move in over an hour.  You have severe headaches that do not go away when you take medicine.  You have vision changes. Summary  The second trimester is from week 14 through week 27 (months 4 through 6). It is also a time when the fetus is growing rapidly.  Your body goes through many changes during pregnancy. The changes vary from woman to woman.  Avoid all smoking, herbs, alcohol, and unprescribed drugs. These chemicals affect the formation and growth your baby.  Do not use any tobacco products, such as cigarettes, chewing tobacco, and e-cigarettes. If you need help quitting, ask your health care provider.  Contact your health care provider if you have any questions. Keep all prenatal visits as told by your health care provider. This is important. This information is not intended to replace advice given to you by your health care provider. Make sure you discuss any questions you have with your health care provider. Document Released: 11/16/2001 Document Revised: 04/29/2016 Document Reviewed: 01/23/2013 Elsevier Interactive Patient Education  2017 [employer].

## 2017-02-16 ENCOUNTER — Encounter: Payer: Self-pay | Admitting: Family Medicine

## 2017-02-16 DIAGNOSIS — N841 Polyp of cervix uteri: Secondary | ICD-10-CM | POA: Insufficient documentation

## 2017-02-16 LAB — CULTURE, OB URINE
Colony Count: NO GROWTH
Organism ID, Bacteria: NO GROWTH

## 2017-02-16 LAB — CYTOLOGY - PAP
CHLAMYDIA, DNA PROBE: NEGATIVE
Diagnosis: NEGATIVE
NEISSERIA GONORRHEA: NEGATIVE
TRICH (WINDOWPATH): NEGATIVE

## 2017-02-22 ENCOUNTER — Ambulatory Visit (HOSPITAL_COMMUNITY)
Admission: RE | Admit: 2017-02-22 | Discharge: 2017-02-22 | Disposition: A | Discharge status: Home / Self Care | Payer: BLUE CROSS/BLUE SHIELD | Source: Ambulatory Visit | Attending: Family Medicine | Admitting: Family Medicine

## 2017-02-22 ENCOUNTER — Other Ambulatory Visit: Payer: Self-pay | Admitting: Family Medicine

## 2017-02-22 DIAGNOSIS — Z3482 Encounter for supervision of other normal pregnancy, second trimester: Secondary | ICD-10-CM

## 2017-02-22 DIAGNOSIS — Z3A26 26 weeks gestation of pregnancy: Secondary | ICD-10-CM | POA: Diagnosis not present

## 2017-02-22 DIAGNOSIS — O99212 Obesity complicating pregnancy, second trimester: Secondary | ICD-10-CM

## 2017-02-22 DIAGNOSIS — Z3689 Encounter for other specified antenatal screening: Secondary | ICD-10-CM | POA: Insufficient documentation

## 2017-02-22 DIAGNOSIS — O132 Gestational [pregnancy-induced] hypertension without significant proteinuria, second trimester: Secondary | ICD-10-CM | POA: Diagnosis not present

## 2017-02-23 ENCOUNTER — Other Ambulatory Visit (INDEPENDENT_AMBULATORY_CARE_PROVIDER_SITE_OTHER): Payer: BLUE CROSS/BLUE SHIELD

## 2017-02-23 DIAGNOSIS — Z3483 Encounter for supervision of other normal pregnancy, third trimester: Secondary | ICD-10-CM

## 2017-02-23 LAB — POCT CBG (FASTING - GLUCOSE)-MANUAL ENTRY: Glucose Fasting, POC: 86 mg/dL (ref 70–99)

## 2017-02-24 LAB — GESTATIONAL GLUCOSE TOLERANCE
Glucose, Fasting: 78 mg/dL (ref 65–94)
Glucose, GTT - 1 Hour: 90 mg/dL (ref 65–179)
Glucose, GTT - 2 Hour: 97 mg/dL (ref 65–154)
Glucose, GTT - 3 Hour: 90 mg/dL (ref 65–139)

## 2017-04-11 ENCOUNTER — Encounter (HOSPITAL_COMMUNITY): Payer: Self-pay | Admitting: *Deleted

## 2017-04-11 ENCOUNTER — Inpatient Hospital Stay (HOSPITAL_COMMUNITY)
Admission: AD | Admit: 2017-04-11 | Discharge: 2017-04-11 | Disposition: A | Discharge status: Home / Self Care | Payer: BLUE CROSS/BLUE SHIELD | Source: Ambulatory Visit | Attending: Obstetrics and Gynecology | Admitting: Obstetrics and Gynecology

## 2017-04-11 DIAGNOSIS — R1013 Epigastric pain: Secondary | ICD-10-CM | POA: Diagnosis not present

## 2017-04-11 DIAGNOSIS — M549 Dorsalgia, unspecified: Secondary | ICD-10-CM | POA: Insufficient documentation

## 2017-04-11 DIAGNOSIS — O26893 Other specified pregnancy related conditions, third trimester: Secondary | ICD-10-CM | POA: Diagnosis not present

## 2017-04-11 DIAGNOSIS — O9989 Other specified diseases and conditions complicating pregnancy, childbirth and the puerperium: Secondary | ICD-10-CM | POA: Diagnosis not present

## 2017-04-11 DIAGNOSIS — R109 Unspecified abdominal pain: Secondary | ICD-10-CM | POA: Diagnosis not present

## 2017-04-11 DIAGNOSIS — R079 Chest pain, unspecified: Secondary | ICD-10-CM | POA: Diagnosis not present

## 2017-04-11 DIAGNOSIS — Z3A33 33 weeks gestation of pregnancy: Secondary | ICD-10-CM | POA: Insufficient documentation

## 2017-04-11 DIAGNOSIS — O133 Gestational [pregnancy-induced] hypertension without significant proteinuria, third trimester: Secondary | ICD-10-CM

## 2017-04-11 LAB — COMPREHENSIVE METABOLIC PANEL
ALBUMIN: 2.7 g/dL — AB (ref 3.5–5.0)
ALK PHOS: 155 U/L — AB (ref 38–126)
ALT: 40 U/L (ref 14–54)
ANION GAP: 9 (ref 5–15)
AST: 63 U/L — ABNORMAL HIGH (ref 15–41)
BUN: 9 mg/dL (ref 6–20)
CALCIUM: 8.4 mg/dL — AB (ref 8.9–10.3)
CO2: 20 mmol/L — AB (ref 22–32)
Chloride: 104 mmol/L (ref 101–111)
Creatinine, Ser: 0.51 mg/dL (ref 0.44–1.00)
GFR calc Af Amer: >60 mL/min (ref >60)
GFR calc non Af Amer: >60 mL/min (ref >60)
GLUCOSE: 130 mg/dL — AB (ref 65–99)
POTASSIUM: 3.4 mmol/L — AB (ref 3.5–5.1)
SODIUM: 133 mmol/L — AB (ref 135–145)
Total Bilirubin: 0.9 mg/dL (ref 0.3–1.2)
Total Protein: 6.7 g/dL (ref 6.5–8.1)

## 2017-04-11 LAB — URINALYSIS, ROUTINE W REFLEX MICROSCOPIC
Bilirubin Urine: NEGATIVE
GLUCOSE, UA: 50 mg/dL — AB
KETONES UR: NEGATIVE mg/dL
Nitrite: NEGATIVE
PROTEIN: 30 mg/dL — AB
Specific Gravity, Urine: 1.018 (ref 1.005–1.030)
pH: 6 (ref 5.0–8.0)

## 2017-04-11 LAB — CBC
HCT: 28.5 % — ABNORMAL LOW (ref 36.0–46.0)
HEMOGLOBIN: 8.9 g/dL — AB (ref 12.0–15.0)
MCH: 21.8 pg — ABNORMAL LOW (ref 26.0–34.0)
MCHC: 31.2 g/dL (ref 30.0–36.0)
MCV: 69.9 fL — ABNORMAL LOW (ref 78.0–100.0)
Platelets: 260 10*3/uL (ref 150–400)
RBC: 4.08 MIL/uL (ref 3.87–5.11)
RDW: 14.9 % (ref 11.5–15.5)
WBC: 9.2 10*3/uL (ref 4.0–10.5)

## 2017-04-11 LAB — PROTEIN / CREATININE RATIO, URINE
Creatinine, Urine: 168 mg/dL
PROTEIN CREATININE RATIO: 0.23 mg/mg{creat} — AB (ref 0.00–0.15)
TOTAL PROTEIN, URINE: 39 mg/dL

## 2017-04-11 LAB — LIPASE, BLOOD: Lipase: 32 U/L (ref 11–51)

## 2017-04-11 MED ORDER — PROMETHAZINE HCL 25 MG PO TABS
25.0000 mg | ORAL_TABLET | Freq: Once | ORAL | Status: AC
  Administered 2017-04-11: 25 mg via ORAL
  Filled 2017-04-11: qty 1

## 2017-04-11 MED ORDER — GI COCKTAIL ~~LOC~~
30.0000 mL | Freq: Once | ORAL | Status: AC
  Administered 2017-04-11: 30 mL via ORAL
  Filled 2017-04-11: qty 30

## 2017-04-11 NOTE — MAU Note (Signed)
PT  SAYS  SHE CAME HOME  FROM  EATING  AT 830PM- THEN  SHE GOT SHARP PAIN IN HER CHEST AND  BACK  - FELT  SHORT OF  BREATH    .  SAYS FEELS  WORSE NOW.   HAD NAUSEA    X1   BUT  NO VOMITING  .   NEVER HAD THIS  BEFORE.     PNC- WITH CLINIC.

## 2017-04-11 NOTE — Discharge Instructions (Signed)

## 2017-04-11 NOTE — MAU Provider Note (Signed)
History     CSN: [id number]  Arrival date and time: 04/11/17 8657   First Provider Initiated Contact with Patient 04/11/17 [phone]      Chief Complaint  Patient presents with  . Abdominal Pain   Ariel Turner is a 26 y.o. G2P1001 at [redacted]w[redacted]d who presents today with sudden onset epigastric and back pain. She states that she ate, and the pain started soon afterward. She has also had off and on chest pain since January. She had a cardiology referral, but has not been to see them at this time. She denies any contractions, VB or LOF. She reports normal fetal movement.    Abdominal Pain  This is a new problem. The current episode started today. The onset quality is sudden. The problem occurs constantly. The problem has been gradually worsening. The pain is located in the epigastric region. The pain is at a severity of 7/10. The quality of the pain is sharp. The abdominal pain radiates to the back. Associated symptoms include nausea. Pertinent negatives include no dysuria, fever, headaches or vomiting. The pain is aggravated by eating (started after eating chipolte ). The pain is relieved by nothing. She has tried nothing for the symptoms. Prior workup: Was referred for cardiology consult, but has not been. Has had off and on chest pain and shortness of breath since 12/2016.    Past Medical History:  Diagnosis Date  . Anemia   . Heart murmur    during infancy  . Pregnancy induced hypertension     Past Surgical History:  Procedure Laterality Date  . NO PAST SURGERIES      Family History  Problem Relation Age of Onset  . Thyroid disease Mother   . Hypertension Father   . Diabetes Maternal Grandmother   . Hypertension Maternal Grandmother   . Cancer Maternal Grandfather     lung    Social History  Substance Use Topics  . Smoking status: Former Smoker    Quit date: 2013  . Smokeless tobacco: Never Used     Comment: quit prior to 1st preg  . Alcohol use No    Allergies: No  Known Allergies  Prescriptions Prior to Admission  Medication Sig Dispense Refill Last Dose  . Prenatal Vit-Fe Fumarate-FA (PRENATAL MULTIVITAMIN) TABS tablet Take 1 tablet by mouth daily at 12 noon.   Past Week at Unknown time    Review of Systems  Constitutional: Negative for chills and fever.  Eyes: Negative for visual disturbance.  Gastrointestinal: Positive for abdominal pain and nausea. Negative for vomiting.  Genitourinary: Negative for dysuria, vaginal bleeding and vaginal discharge.  Neurological: Negative for headaches.   Physical Exam   Blood pressure 133/87, pulse 89, temperature 98.8 F (37.1 C), height 5\' 2"  (1.575 m), weight 203 lb (92.1 kg), last menstrual period 08/18/2016, SpO2 98 %, currently breastfeeding.  Physical Exam  Nursing note and vitals reviewed. Constitutional: She is oriented to person, place, and time. She appears well-developed and well-nourished. No distress.  HENT:  Head: Normocephalic.  Cardiovascular: Normal rate and regular rhythm.   Respiratory: Effort normal and breath sounds normal.  GI: Soft. There is no tenderness. There is no rebound.  Neurological: She is alert and oriented to person, place, and time. She has normal reflexes.  No clonus   Skin: Skin is warm and dry.  Psychiatric: She has a normal mood and affect.   FHT: 135, moderate with 15x15 accels, no decels Toco: no UCs   Results for orders placed  or performed during the hospital encounter of 04/11/17 (from the past 24 hour(s))  Urinalysis, Routine w reflex microscopic     Status: Abnormal   Collection Time: 04/11/17  2:50 AM  Result Value Ref Range   Color, Urine AMBER (A) YELLOW   APPearance CLOUDY (A) CLEAR   Specific Gravity, Urine 1.018 1.005 - 1.030   pH 6.0 5.0 - 8.0   Glucose, UA 50 (A) NEGATIVE mg/dL   Hgb urine dipstick SMALL (A) NEGATIVE   Bilirubin Urine NEGATIVE NEGATIVE   Ketones, ur NEGATIVE NEGATIVE mg/dL   Protein, ur 30 (A) NEGATIVE mg/dL   Nitrite  NEGATIVE NEGATIVE   Leukocytes, UA MODERATE (A) NEGATIVE   RBC / HPF TOO NUMEROUS TO COUNT 0 - 5 RBC/hpf   WBC, UA TOO NUMEROUS TO COUNT 0 - 5 WBC/hpf   Bacteria, UA RARE (A) NONE SEEN   Squamous Epithelial / LPF 6-30 (A) NONE SEEN   Mucous PRESENT   Protein / creatinine ratio, urine     Status: Abnormal   Collection Time: 04/11/17  2:50 AM  Result Value Ref Range   Creatinine, Urine 168.00 mg/dL   Total Protein, Urine 39 mg/dL   Protein Creatinine Ratio 0.23 (H) 0.00 - 0.15 mg/mg[Cre]  CBC     Status: Abnormal   Collection Time: 04/11/17  3:17 AM  Result Value Ref Range   WBC 9.2 4.0 - 10.5 K/uL   RBC 4.08 3.87 - 5.11 MIL/uL   Hemoglobin 8.9 (L) 12.0 - 15.0 g/dL   HCT 09.828.5 (L) 11.936.0 - 14.746.0 %   MCV 69.9 (L) 78.0 - 100.0 fL   MCH 21.8 (L) 26.0 - 34.0 pg   MCHC 31.2 30.0 - 36.0 g/dL   RDW 82.914.9 56.211.5 - 13.015.5 %   Platelets 260 150 - 400 K/uL  Comprehensive metabolic panel     Status: Abnormal   Collection Time: 04/11/17  3:17 AM  Result Value Ref Range   Sodium 133 (L) 135 - 145 mmol/L   Potassium 3.4 (L) 3.5 - 5.1 mmol/L   Chloride 104 101 - 111 mmol/L   CO2 20 (L) 22 - 32 mmol/L   Glucose, Bld 130 (H) 65 - 99 mg/dL   BUN 9 6 - 20 mg/dL   Creatinine, Ser 8.650.51 0.44 - 1.00 mg/dL   Calcium 8.4 (L) 8.9 - 10.3 mg/dL   Total Protein 6.7 6.5 - 8.1 g/dL   Albumin 2.7 (L) 3.5 - 5.0 g/dL   AST 63 (H) 15 - 41 U/L   ALT 40 14 - 54 U/L   Alkaline Phosphatase 155 (H) 38 - 126 U/L   Total Bilirubin 0.9 0.3 - 1.2 mg/dL   GFR calc non Af Amer >60 >60 mL/min   GFR calc Af Amer >60 >60 mL/min   Anion gap 9 5 - 15  Lipase, blood     Status: None   Collection Time: 04/11/17  3:17 AM  Result Value Ref Range   Lipase 32 11 - 51 U/L    MAU Course  Procedures  MDM 0353: Patient has had GI cocktail. She states that the epigastric pain has improved. She is still having some pain in her back.  [id number]: D/W Dr. Christophe LouisFerguson Ok for DC home with gallbladder US outpatient, and short interval follow up  (needs to be seen within the week).    Assessment and Plan   1. Epigastric pain   2. Hypertension in pregnancy, transient, antepartum, third trimester   3. [redacted] weeks  gestation of pregnancy    DC home Comfort measures reviewed  3rd Trimester precautions  PTL precautions  Fetal kick counts RX: none  Return to MAU as needed OB will call you with an appointment to be seen within the week.   Follow-up Information    [facility] Follow up.   Specialty:  Family Medicine Why:  They will call you for an appointment this week for follow up. If you have not heard from them please call so that you can be seen by the end of this week.  Contact information: [address] [id number] mc [place] [address] [phone]       [facility] Follow up.   Specialty:  Radiology Why:  They will call you to schedule an abdominal ultrasound.  Contact information: [address] [id number] mc [place] [address] [phone]          Thressa Sheller 04/11/2017, 4:50 AM

## 2017-04-12 ENCOUNTER — Telehealth: Payer: Self-pay | Admitting: *Deleted

## 2017-04-12 NOTE — Telephone Encounter (Signed)
-----   Message from Armando ReichertHeather D Hogan, CNM sent at 04/11/2017  4:55 AM EDT ----- Regarding: Patient needs short interval follow up  Ariel Turner,Ariel Turner [[id number]][[id number]] Hi guys, I believe that this patient is on the red team. She was seen in MAU with some epigastric pain, and borderline B/P. Labs showed elevated AST. Planning to do an outpatient gallbladder US, and she needs to be seen within the week for FU and repeat labs. Please confirm that you have received this message. Thank you,  Thressa ShellerHeather Hogan

## 2017-04-12 NOTE — Telephone Encounter (Signed)
Left message on voicemail for patient to call back to schedule an appointment this week.

## 2017-04-13 NOTE — Telephone Encounter (Signed)
Pt returns call.  She is already scheduled for appt with Dr. Artist PaisYoo for Friday. Will forward to her. Charels Stambaugh, Maryjo RochesterJessica Dawn, CMA

## 2017-04-15 ENCOUNTER — Ambulatory Visit (INDEPENDENT_AMBULATORY_CARE_PROVIDER_SITE_OTHER): Payer: BLUE CROSS/BLUE SHIELD | Admitting: Family Medicine

## 2017-04-15 VITALS — BP 110/60 | HR 81 | Temp 97.7°F | Wt 205.0 lb

## 2017-04-15 DIAGNOSIS — Z23 Encounter for immunization: Secondary | ICD-10-CM | POA: Diagnosis not present

## 2017-04-15 DIAGNOSIS — Z3483 Encounter for supervision of other normal pregnancy, third trimester: Secondary | ICD-10-CM | POA: Diagnosis not present

## 2017-04-15 LAB — POCT URINALYSIS DIP (MANUAL ENTRY)
Bilirubin, UA: NEGATIVE
GLUCOSE UA: NEGATIVE mg/dL
Ketones, POC UA: NEGATIVE mg/dL
Nitrite, UA: NEGATIVE
SPEC GRAV UA: 1.02 (ref 1.010–1.025)
UROBILINOGEN UA: 1 U/dL
pH, UA: 7 (ref 5.0–8.0)

## 2017-04-15 LAB — POCT UA - MICROSCOPIC ONLY

## 2017-04-15 NOTE — Patient Instructions (Signed)
It was good to meet you today  Thank you for getting your TDAP today.   We are checking some labs today, and someone will call you or send you a letter with the results when they are available.   Your next prenatal visit is with Dr. Pollie MeyerMcIntyre in 2 weeks. Please see below for details  Take care and seek immediate care sooner if you develop any concerns.   Dr. Leland HerElsia J Nnaemeka Samson, DO [facility]

## 2017-04-15 NOTE — Addendum Note (Signed)
Addended by: Jennette BillBUSICK, Ivan Lacher L on: 04/15/2017 03:53 PM   Modules accepted: Orders

## 2017-04-15 NOTE — Progress Notes (Signed)
Ariel Turner is a 26 y.o. G2P1001 at 5220w2d here for OB follow up.  She reports that after her visit to the MAU for midepigastric/back pain on 04/11/17, that she had one addition episode of similar pain the following night on 04/12/17 that was located only in her back and was much less in intensity and that self resolved. She has no further episode of chest/abdominal/back pain. She has noticed some mild swelling of her feet. But no HA, vision changes.  See flow sheet for details.  A/P: Pregnancy at 5420w2d.  Doing well.   Pregnancy issues include: Recent MAU visit on 04/11/17 for midepigastric pain found to have elevated AST to 63 and BP 133/87 - Check CBC, CMP, UA today  - Patient instructed to call to schedule outpatient gallbladder ultrasound  Infant feeding choice: Breastfeed  Contraception choice: undecided, partner possible planning on having vasectomy. Infant circumcision desired: probably not, considering.   Tdap was given today. Checking HIV and RPR routine screening.  Preterm labor and fetal movement precautions reviewed. Safe sleep discussed. Follow up 2 weeks.  Leland HerElsia J Decari Duggar, DO PGY-1, [facility] 04/15/2017 3:22 PM

## 2017-04-16 LAB — CMP14+EGFR
ALK PHOS: 183 IU/L — AB (ref 39–117)
ALT: 30 IU/L (ref 0–32)
AST: 15 IU/L (ref 0–40)
Albumin/Globulin Ratio: 1.1 — ABNORMAL LOW (ref 1.2–2.2)
Albumin: 3.3 g/dL — ABNORMAL LOW (ref 3.5–5.5)
BUN/Creatinine Ratio: 16 (ref 9–23)
BUN: 7 mg/dL (ref 6–20)
Bilirubin Total: 0.3 mg/dL (ref 0.0–1.2)
CO2: 20 mmol/L (ref 18–29)
CREATININE: 0.45 mg/dL — AB (ref 0.57–1.00)
Calcium: 8.4 mg/dL — ABNORMAL LOW (ref 8.7–10.2)
Chloride: 104 mmol/L (ref 96–106)
GFR calc Af Amer: 161 mL/min/{1.73_m2} (ref >59)
GFR calc non Af Amer: 140 mL/min/{1.73_m2} (ref >59)
GLUCOSE: 102 mg/dL — AB (ref 65–99)
Globulin, Total: 3 g/dL (ref 1.5–4.5)
Potassium: 4 mmol/L (ref 3.5–5.2)
Sodium: 137 mmol/L (ref 134–144)
Total Protein: 6.3 g/dL (ref 6.0–8.5)

## 2017-04-16 LAB — CBC
Hematocrit: 28.5 % — ABNORMAL LOW (ref 34.0–46.6)
Hemoglobin: 8.9 g/dL — ABNORMAL LOW (ref 11.1–15.9)
MCH: 21.3 pg — AB (ref 26.6–33.0)
MCHC: 31.2 g/dL — AB (ref 31.5–35.7)
MCV: 68 fL — ABNORMAL LOW (ref 79–97)
PLATELETS: 238 10*3/uL (ref 150–379)
RBC: 4.17 x10E6/uL (ref 3.77–5.28)
RDW: 16 % — ABNORMAL HIGH (ref 12.3–15.4)
WBC: 8.2 10*3/uL (ref 3.4–10.8)

## 2017-04-16 LAB — RPR: RPR: NONREACTIVE

## 2017-04-16 LAB — HIV ANTIBODY (ROUTINE TESTING W REFLEX): HIV SCREEN 4TH GENERATION: NONREACTIVE

## 2017-04-26 ENCOUNTER — Encounter: Payer: BLUE CROSS/BLUE SHIELD | Admitting: Family Medicine

## 2017-05-12 ENCOUNTER — Encounter: Payer: BLUE CROSS/BLUE SHIELD | Admitting: Student

## 2017-05-18 ENCOUNTER — Other Ambulatory Visit (HOSPITAL_COMMUNITY)
Admission: RE | Admit: 2017-05-18 | Discharge: 2017-05-18 | Disposition: A | Discharge status: Home / Self Care | Payer: Medicaid Other | Source: Ambulatory Visit | Attending: Family Medicine | Admitting: Family Medicine

## 2017-05-18 ENCOUNTER — Ambulatory Visit (INDEPENDENT_AMBULATORY_CARE_PROVIDER_SITE_OTHER): Payer: Medicaid Other | Admitting: Student

## 2017-05-18 DIAGNOSIS — Z3A39 39 weeks gestation of pregnancy: Secondary | ICD-10-CM | POA: Insufficient documentation

## 2017-05-18 DIAGNOSIS — Z3483 Encounter for supervision of other normal pregnancy, third trimester: Secondary | ICD-10-CM | POA: Diagnosis not present

## 2017-05-18 LAB — OB RESULTS CONSOLE GC/CHLAMYDIA: GC PROBE AMP, GENITAL: NEGATIVE

## 2017-05-18 NOTE — Progress Notes (Signed)
G2P1001 at 39/0 (LMP=U=6) Pregnancy complicated my mild transaminitis ( AST 63), resolved on last check S: no contractions, loss or fluid, vaginal bleeding, baby is moving well O: See tab  A/P IUP at 39/0 Routine OB: GBS, GC/Ct today, s/p Tdap Post partum: breast, boy- desires circ- considering pp contraception as she does not wish to be pregnant again soon, LARC counseling given  Labor precautions discussed RTC in 1 week  Brysyn Brandenberger A. Kennon RoundsHaney MD, MS Family Medicine Resident PGY-3 Pager [phone]

## 2017-05-18 NOTE — Addendum Note (Signed)
Addended by: Steva ColderSCOTT, Teddie Mehta P on: 05/18/2017 03:17 PM   Modules accepted: Orders

## 2017-05-18 NOTE — Patient Instructions (Signed)
Follow up in 1 week If you feel you have strong , regular contractions, loss of fluid like you broke your water, vaginal bleeding like or greater than a period go to [facility]. If you feel your baby is moving less, sit in a quiet place for 2 hours and count baby's movements. You want at least 10 movements in 2 hours. If you have less than this go to [facility] hospital.   Third Trimester of Pregnancy The third trimester is from week 29 through week 42, months 7 through 9. This trimester is when your unborn baby (fetus) is growing very fast. At the end of the ninth month, the unborn baby is about 20 inches in length. It weighs about 6-10 pounds. Follow these instructions at home:  Avoid all smoking, herbs, and alcohol. Avoid drugs not approved by your doctor.  Do not use any tobacco products, including cigarettes, chewing tobacco, and electronic cigarettes. If you need help quitting, ask your doctor. You may get counseling or other support to help you quit.  Only take medicine as told by your doctor. Some medicines are safe and some are not during pregnancy.  Exercise only as told by your doctor. Stop exercising if you start having cramps.  Eat regular, healthy meals.  Wear a good support bra if your breasts are tender.  Do not use hot tubs, steam rooms, or saunas.  Wear your seat belt when driving.  Avoid raw meat, uncooked cheese, and liter boxes and soil used by cats.  Take your prenatal vitamins.  Take 1500-2000 milligrams of calcium daily starting at the 20th week of pregnancy until you deliver your baby.  Try taking medicine that helps you poop (stool softener) as needed, and if your doctor approves. Eat more fiber by eating fresh fruit, vegetables, and whole grains. Drink enough fluids to keep your pee (urine) clear or pale yellow.  Take warm water baths (sitz baths) to soothe pain or discomfort caused by hemorrhoids. Use hemorrhoid cream if your doctor approves.  If you  have puffy, bulging veins (varicose veins), wear support hose. Raise (elevate) your feet for 15 minutes, 3-4 times a day. Limit salt in your diet.  Avoid heavy lifting, wear low heels, and sit up straight.  Rest with your legs raised if you have leg cramps or low back pain.  Visit your dentist if you have not gone during your pregnancy. Use a soft toothbrush to brush your teeth. Be gentle when you floss.  You can have sex (intercourse) unless your doctor tells you not to.  Do not travel far distances unless you must. Only do so with your doctor's approval.  Take prenatal classes.  Practice driving to the hospital.  Pack your hospital bag.  Prepare the baby's room.  Go to your doctor visits. Get help if:  You are not sure if you are in labor or if your water has broken.  You are dizzy.  You have mild cramps or pressure in your lower belly (abdominal).  You have a nagging pain in your belly area.  You continue to feel sick to your stomach (nauseous), throw up (vomit), or have watery poop (diarrhea).  You have bad smelling fluid coming from your vagina.  You have pain with peeing (urination). Get help right away if:  You have a fever.  You are leaking fluid from your vagina.  You are spotting or bleeding from your vagina.  You have severe belly cramping or pain.  You lose or gain  weight rapidly.  You have trouble catching your breath and have chest pain.  You notice sudden or extreme puffiness (swelling) of your face, hands, ankles, feet, or legs.  You have not felt the baby move in over an hour.  You have severe headaches that do not go away with medicine.  You have vision changes. This information is not intended to replace advice given to you by your health care provider. Make sure you discuss any questions you have with your health care provider. Document Released: 02/16/2010 Document Revised: 04/29/2016 Document Reviewed: 01/23/2013 Elsevier Interactive  Patient Education  2017 [employer]

## 2017-05-19 LAB — CERVICOVAGINAL ANCILLARY ONLY
CHLAMYDIA, DNA PROBE: NEGATIVE
NEISSERIA GONORRHEA: NEGATIVE

## 2017-05-22 LAB — CULTURE, BETA STREP (GROUP B ONLY): Strep Gp B Culture: NEGATIVE

## 2017-05-30 ENCOUNTER — Inpatient Hospital Stay (HOSPITAL_COMMUNITY)
Admission: AD | Admit: 2017-05-30 | Discharge: 2017-06-02 | DRG: 775 | Disposition: A | Discharge status: Home / Self Care | Payer: Medicaid Other | Source: Ambulatory Visit | Attending: Obstetrics and Gynecology | Admitting: Obstetrics and Gynecology

## 2017-05-30 ENCOUNTER — Ambulatory Visit (INDEPENDENT_AMBULATORY_CARE_PROVIDER_SITE_OTHER): Payer: Self-pay | Admitting: Student

## 2017-05-30 ENCOUNTER — Encounter (HOSPITAL_COMMUNITY): Payer: Self-pay | Admitting: *Deleted

## 2017-05-30 VITALS — BP 148/90 | HR 72 | Temp 98.0°F | Wt 215.8 lb

## 2017-05-30 DIAGNOSIS — O139 Gestational [pregnancy-induced] hypertension without significant proteinuria, unspecified trimester: Secondary | ICD-10-CM | POA: Insufficient documentation

## 2017-05-30 DIAGNOSIS — O1414 Severe pre-eclampsia complicating childbirth: Principal | ICD-10-CM | POA: Diagnosis present

## 2017-05-30 DIAGNOSIS — Z3A40 40 weeks gestation of pregnancy: Secondary | ICD-10-CM

## 2017-05-30 DIAGNOSIS — O1490 Unspecified pre-eclampsia, unspecified trimester: Secondary | ICD-10-CM | POA: Diagnosis present

## 2017-05-30 DIAGNOSIS — O133 Gestational [pregnancy-induced] hypertension without significant proteinuria, third trimester: Secondary | ICD-10-CM

## 2017-05-30 DIAGNOSIS — O1494 Unspecified pre-eclampsia, complicating childbirth: Secondary | ICD-10-CM | POA: Diagnosis not present

## 2017-05-30 DIAGNOSIS — Z87891 Personal history of nicotine dependence: Secondary | ICD-10-CM | POA: Diagnosis not present

## 2017-05-30 DIAGNOSIS — Z3483 Encounter for supervision of other normal pregnancy, third trimester: Secondary | ICD-10-CM

## 2017-05-30 LAB — COMPREHENSIVE METABOLIC PANEL
ALT: 11 U/L — AB (ref 14–54)
AST: 18 U/L (ref 15–41)
Albumin: 2.8 g/dL — ABNORMAL LOW (ref 3.5–5.0)
Alkaline Phosphatase: 176 U/L — ABNORMAL HIGH (ref 38–126)
Anion gap: 9 (ref 5–15)
BUN: 11 mg/dL (ref 6–20)
CHLORIDE: 105 mmol/L (ref 101–111)
CO2: 21 mmol/L — ABNORMAL LOW (ref 22–32)
CREATININE: 0.54 mg/dL (ref 0.44–1.00)
Calcium: 9 mg/dL (ref 8.9–10.3)
GFR calc Af Amer: >60 mL/min (ref >60)
Glucose, Bld: 106 mg/dL — ABNORMAL HIGH (ref 65–99)
Potassium: 3.7 mmol/L (ref 3.5–5.1)
Sodium: 135 mmol/L (ref 135–145)
Total Bilirubin: 0.4 mg/dL (ref 0.3–1.2)
Total Protein: 6.7 g/dL (ref 6.5–8.1)

## 2017-05-30 LAB — TYPE AND SCREEN
ABO/RH(D): A POS
ANTIBODY SCREEN: NEGATIVE

## 2017-05-30 LAB — CBC
HEMATOCRIT: 27.5 % — AB (ref 36.0–46.0)
HEMOGLOBIN: 8.3 g/dL — AB (ref 12.0–15.0)
MCH: 19.9 pg — ABNORMAL LOW (ref 26.0–34.0)
MCHC: 30.2 g/dL (ref 30.0–36.0)
MCV: 65.9 fL — AB (ref 78.0–100.0)
PLATELETS: 244 10*3/uL (ref 150–400)
RBC: 4.17 MIL/uL (ref 3.87–5.11)
RDW: 16.6 % — ABNORMAL HIGH (ref 11.5–15.5)
WBC: 9.4 10*3/uL (ref 4.0–10.5)

## 2017-05-30 LAB — PROTEIN / CREATININE RATIO, URINE
CREATININE, URINE: 299 mg/dL
Protein Creatinine Ratio: 0.41 mg/mg{Cre} — ABNORMAL HIGH (ref 0.00–0.15)
Total Protein, Urine: 123 mg/dL

## 2017-05-30 MED ORDER — LIDOCAINE HCL (PF) 1 % IJ SOLN
30.0000 mL | INTRAMUSCULAR | Status: DC | PRN
  Filled 2017-05-30: qty 30

## 2017-05-30 MED ORDER — OXYTOCIN BOLUS FROM INFUSION
500.0000 mL | Freq: Once | INTRAVENOUS | Status: AC
  Administered 2017-05-31: 500 mL via INTRAVENOUS

## 2017-05-30 MED ORDER — TERBUTALINE SULFATE 1 MG/ML IJ SOLN: 0.2500 mg | Freq: Once | INTRAMUSCULAR | Status: DC | PRN

## 2017-05-30 MED ORDER — LACTATED RINGERS IV SOLN
INTRAVENOUS | Status: DC
  Administered 2017-05-30: 16:00:00 via INTRAVENOUS

## 2017-05-30 MED ORDER — OXYCODONE-ACETAMINOPHEN 5-325 MG PO TABS: 1.0000 | ORAL_TABLET | ORAL | Status: DC | PRN

## 2017-05-30 MED ORDER — LACTATED RINGERS IV SOLN: 500.0000 mL | INTRAVENOUS | Status: DC | PRN

## 2017-05-30 MED ORDER — OXYTOCIN 40 UNITS IN LACTATED RINGERS INFUSION - SIMPLE MED
1.0000 m[IU]/min | INTRAVENOUS | Status: DC
  Administered 2017-05-30: 2 m[IU]/min via INTRAVENOUS

## 2017-05-30 MED ORDER — FLEET ENEMA 7-19 GM/118ML RE ENEM: 1.0000 | ENEMA | Freq: Every day | RECTAL | Status: DC | PRN

## 2017-05-30 MED ORDER — OXYCODONE-ACETAMINOPHEN 5-325 MG PO TABS: 2.0000 | ORAL_TABLET | ORAL | Status: DC | PRN

## 2017-05-30 MED ORDER — ACETAMINOPHEN 325 MG PO TABS: 650.0000 mg | ORAL_TABLET | ORAL | Status: DC | PRN

## 2017-05-30 MED ORDER — SOD CITRATE-CITRIC ACID 500-334 MG/5ML PO SOLN: 30.0000 mL | ORAL | Status: DC | PRN

## 2017-05-30 MED ORDER — ONDANSETRON HCL 4 MG/2ML IJ SOLN: 4.0000 mg | Freq: Four times a day (QID) | INTRAMUSCULAR | Status: DC | PRN

## 2017-05-30 MED ORDER — FENTANYL CITRATE (PF) 100 MCG/2ML IJ SOLN
100.0000 ug | INTRAMUSCULAR | Status: DC | PRN
  Administered 2017-05-31 (×2): 100 ug via INTRAVENOUS
  Filled 2017-05-30 (×2): qty 2

## 2017-05-30 MED ORDER — OXYTOCIN 40 UNITS IN LACTATED RINGERS INFUSION - SIMPLE MED
2.5000 [IU]/h | INTRAVENOUS | Status: DC
  Filled 2017-05-30: qty 1000

## 2017-05-30 MED ORDER — MISOPROSTOL 200 MCG PO TABS
50.0000 ug | ORAL_TABLET | ORAL | Status: DC | PRN
  Administered 2017-05-30: 50 ug via ORAL
  Filled 2017-05-30: qty 1

## 2017-05-30 NOTE — Patient Instructions (Signed)
[facility] Your blood pressure was elevated and you may need to be induced Call the office with any questions or concerns

## 2017-05-30 NOTE — MAU Provider Note (Signed)
History     CSN: [id number]  Arrival date and time: 05/30/17 1336  Chief Complaint  Patient presents with  . Hypertension   G2P1001 @40 .5 wks sent from office for elevated BPs. She denies HA, visual disturbances, and epigastric pain. Reports good FM. No VB, LOF, and ctx. Her pregnancy has been uncomplicated.     OB History    Gravida Para Term Preterm AB Living   2 1 1     1    SAB TAB Ectopic Multiple Live Births           1      Past Medical History:  Diagnosis Date  . Anemia   . Heart murmur    during infancy  . Pregnancy induced hypertension     Past Surgical History:  Procedure Laterality Date  . NO PAST SURGERIES      Family History  Problem Relation Age of Onset  . Thyroid disease Mother   . Hypertension Father   . Diabetes Maternal Grandmother   . Hypertension Maternal Grandmother   . Cancer Maternal Grandfather        lung    Social History  Substance Use Topics  . Smoking status: Former Smoker    Quit date: 2013  . Smokeless tobacco: Never Used     Comment: quit prior to 1st preg  . Alcohol use No    Allergies: No Known Allergies  Prescriptions Prior to Admission  Medication Sig Dispense Refill Last Dose  . Prenatal Vit-Fe Fumarate-FA (PRENATAL MULTIVITAMIN) TABS tablet Take 1 tablet by mouth daily at 12 noon.   Past Week at Unknown time    Review of Systems  Constitutional: Negative.   Eyes: Negative for visual disturbance.  Gastrointestinal: Negative for abdominal pain.  Genitourinary: Negative for vaginal bleeding.  Neurological: Negative for headaches.   Physical Exam   Blood pressure (!) 151/95, pulse 91, temperature 98.8 F (37.1 C), temperature source Oral, resp. rate 18, height 5\' 3"  (1.6 m), weight 98 kg (216 lb 1.3 oz), last menstrual period 08/18/2016, currently breastfeeding.  Physical Exam  Nursing note and vitals reviewed. Constitutional: She is oriented to person, place, and time. She appears well-developed and  well-nourished. No distress.  HENT:  Head: Normocephalic and atraumatic.  Neck: Normal range of motion.  Cardiovascular: Normal rate.   Respiratory: Effort normal. No respiratory distress.  GI: Soft. She exhibits no distension. There is no tenderness.  gravid  Musculoskeletal: Normal range of motion.  Neurological: She is alert and oriented to person, place, and time.  Skin: Skin is warm and dry.  Psychiatric: She has a normal mood and affect.  EFM: 135 bpm, mod variability, + accels, no decels Toco: rare  Results for orders placed or performed during the hospital encounter of 05/30/17 (from the past 24 hour(s))  Protein / creatinine ratio, urine     Status: Abnormal   Collection Time: 05/30/17  1:50 PM  Result Value Ref Range   Creatinine, Urine 299.00 mg/dL   Total Protein, Urine 123 mg/dL   Protein Creatinine Ratio 0.41 (H) 0.00 - 0.15 mg/mg[Cre]  CBC     Status: Abnormal   Collection Time: 05/30/17  2:25 PM  Result Value Ref Range   WBC 9.4 4.0 - 10.5 K/uL   RBC 4.17 3.87 - 5.11 MIL/uL   Hemoglobin 8.3 (L) 12.0 - 15.0 g/dL   HCT 14.7 (L) 82.9 - 56.2 %   MCV 65.9 (L) 78.0 - 100.0 fL   MCH 19.9 (L)  26.0 - 34.0 pg   MCHC 30.2 30.0 - 36.0 g/dL   RDW 16.116.6 (H) 09.611.5 - 04.515.5 %   Platelets 244 150 - 400 K/uL  Comprehensive metabolic panel     Status: Abnormal   Collection Time: 05/30/17  2:25 PM  Result Value Ref Range   Sodium 135 135 - 145 mmol/L   Potassium 3.7 3.5 - 5.1 mmol/L   Chloride 105 101 - 111 mmol/L   CO2 21 (L) 22 - 32 mmol/L   Glucose, Bld 106 (H) 65 - 99 mg/dL   BUN 11 6 - 20 mg/dL   Creatinine, Ser 4.090.54 0.44 - 1.00 mg/dL   Calcium 9.0 8.9 - 81.110.3 mg/dL   Total Protein 6.7 6.5 - 8.1 g/dL   Albumin 2.8 (L) 3.5 - 5.0 g/dL   AST 18 15 - 41 U/L   ALT 11 (L) 14 - 54 U/L   Alkaline Phosphatase 176 (H) 38 - 126 U/L   Total Bilirubin 0.4 0.3 - 1.2 mg/dL   GFR calc non Af Amer >60 >60 mL/min   GFR calc Af Amer >60 >60 mL/min   Anion gap 9 5 - 15   MAU Course   Procedures  MDM Labs ordered and reviewed. Will admit for pre-e.   Assessment and Plan  40 weeks Preeclampsia Admit to BS IOL  Donette LarryMelanie Kaleisha Bhargava, CNM 05/30/2017, 2:53 PM

## 2017-05-30 NOTE — H&P (Signed)
LABOR AND DELIVERY ADMISSION HISTORY AND PHYSICAL NOTE  Ariel Turner is a 26 y.o. female G2P1001 with IUP at 6775w5d by LMP consistent wit h6 week ultrasound presenting for IOL for preeclampsia. She was noted to have persistently elevated pressures in clinic today and so sent to MAU for evaluation for preeclampsia. In the MAU she was noted to have pressures 15s/100.  PreE labs were notable for Pr:Cr 0.41, labs otherwise unremarkable. Decision was made to admit for IOL for preE without severe features.  She reports +FM, + contractions, No LOF, no VB, no blurry vision, headaches or peripheral edema, and RUQ pain.  She plans on breast and bottle feeding.   Prenatal History/Complications:  Past Medical History: Past Medical History:  Diagnosis Date  . Anemia   . Heart murmur    during infancy  . Pregnancy induced hypertension     Past Surgical History: Past Surgical History:  Procedure Laterality Date  . NO PAST SURGERIES      Obstetrical History: OB History    Gravida Para Term Preterm AB Living   2 1 1     1    SAB TAB Ectopic Multiple Live Births           1      Social History: Social History   Social History  . Marital status: Single    Spouse name: N/A  . Number of children: N/A  . Years of education: N/A   Social History Main Topics  . Smoking status: Former Smoker    Quit date: 2013  . Smokeless tobacco: Never Used     Comment: quit prior to 1st preg  . Alcohol use No  . Drug use: No  . Sexual activity: Yes    Birth control/ protection: None   Other Topics Concern  . None   Social History Narrative  . None    Family History: Family History  Problem Relation Age of Onset  . Thyroid disease Mother   . Hypertension Father   . Diabetes Maternal Grandmother   . Hypertension Maternal Grandmother   . Cancer Maternal Grandfather        lung    Allergies: No Known Allergies  Prescriptions Prior to Admission  Medication Sig Dispense Refill Last  Dose  . Prenatal Vit-Fe Fumarate-FA (PRENATAL MULTIVITAMIN) TABS tablet Take 1 tablet by mouth daily at 12 noon.   Past Week at Unknown time     Review of Systems   All systems reviewed and negative except as stated in HPI  Physical Exam Blood pressure 138/90, pulse 86, temperature 98.8 F (37.1 C), temperature source Oral, resp. rate 18, height 5\' 3"  (1.6 m), weight 98 kg (216 lb 1.3 oz), last menstrual period 08/18/2016, currently breastfeeding. General appearance: alert, cooperative and appears stated age  CARD: RRR, no m/r/g PULM: CTA bil, no W/R/R, good effort Neuro: 1+ patellar reflexes appreciated bilaterally Presentation: cephalic confirmed by BSU/S Fetal monitoring: 130 bmp, mod var ,accels no decels Uterine activity: not contracting     Prenatal labs: ABO, Rh: A/POS/-- (01/15 1121) Antibody: NEG (01/15 1121) Rubella: immune RPR: Non Reactive (05/11 1528)  HBsAg: NEGATIVE (01/15 1121)  HIV: Non Reactive (05/11 1528)  GBS:   negative 1 hr Glucola: normal Genetic screening:  declined Anatomy US: normal  Prenatal Transfer Tool  Maternal Diabetes: No Genetic Screening: Declined Maternal Ultrasounds/Referrals: Normal Fetal Ultrasounds or other Referrals:  None Maternal Substance Abuse:  No Significant Maternal Medications:  None Significant Maternal Lab Results: Lab  values include: Group B Strep negative  Results for orders placed or performed during the hospital encounter of 05/30/17 (from the past 24 hour(s))  Protein / creatinine ratio, urine   Collection Time: 05/30/17  1:50 PM  Result Value Ref Range   Creatinine, Urine 299.00 mg/dL   Total Protein, Urine 123 mg/dL   Protein Creatinine Ratio 0.41 (H) 0.00 - 0.15 mg/mg[Cre]  CBC   Collection Time: 05/30/17  2:25 PM  Result Value Ref Range   WBC 9.4 4.0 - 10.5 K/uL   RBC 4.17 3.87 - 5.11 MIL/uL   Hemoglobin 8.3 (L) 12.0 - 15.0 g/dL   HCT 16.1 (L) 09.6 - 04.5 %   MCV 65.9 (L) 78.0 - 100.0 fL   MCH 19.9  (L) 26.0 - 34.0 pg   MCHC 30.2 30.0 - 36.0 g/dL   RDW 40.9 (H) 81.1 - 91.4 %   Platelets 244 150 - 400 K/uL  Comprehensive metabolic panel   Collection Time: 05/30/17  2:25 PM  Result Value Ref Range   Sodium 135 135 - 145 mmol/L   Potassium 3.7 3.5 - 5.1 mmol/L   Chloride 105 101 - 111 mmol/L   CO2 21 (L) 22 - 32 mmol/L   Glucose, Bld 106 (H) 65 - 99 mg/dL   BUN 11 6 - 20 mg/dL   Creatinine, Ser 7.82 0.44 - 1.00 mg/dL   Calcium 9.0 8.9 - 95.6 mg/dL   Total Protein 6.7 6.5 - 8.1 g/dL   Albumin 2.8 (L) 3.5 - 5.0 g/dL   AST 18 15 - 41 U/L   ALT 11 (L) 14 - 54 U/L   Alkaline Phosphatase 176 (H) 38 - 126 U/L   Total Bilirubin 0.4 0.3 - 1.2 mg/dL   GFR calc non Af Amer >60 >60 mL/min   GFR calc Af Amer >60 >60 mL/min   Anion gap 9 5 - 15    Patient Active Problem List   Diagnosis Date Noted  . Gestational hypertension 05/30/2017  . Cervical polyp 02/16/2017  . Shortness of breath 12/20/2016  . Supervision of normal pregnancy 10/05/2016  . 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 G2P1001 at [redacted]w[redacted]d here for IOL for preeclampsia (Pr:Cr 0.41)  #Labor: Admit for IOL. Cervix high and posterior, unable to place foley with spec, will start with PO cytotec #Pain: Epidural on requset #FWB: Category I #ID:  GBS neg #MOF: breast and bottle #MOC:undecided #Circ:  outpatient  Howard Pouch, MD PGY-1 [facility] 05/30/2017, 3:28 PM  OB FELLOW HISTORY AND PHYSICAL ATTESTATION  I have seen and examined this patient; I agree with above documentation in the resident's note.     Jen Mow, DO OB Fellow 05/30/2017, 6:00 PM

## 2017-05-30 NOTE — Progress Notes (Signed)
Patient ID: Ariel Turner, female   DOB: 08/10/1991, 26 y.o.   MRN: [id number]  S: Patient seen & examined for progress of labor. Patient comfortable. Denies HA, changes in vision, RUQ/epigastric pain   O:  Vitals:   05/30/17 1900 05/30/17 1958 05/30/17 2222 05/30/17 2231  BP: (!) 143/97 134/82 (!) 135/98 (!) 151/103  Pulse: 86 82 94 94  Resp: 18     Temp:  98.5 F (36.9 C) 98.6 F (37 C)   TempSrc:  Oral Oral   Weight:      Height:        Dilation: 3 Effacement (%): 70 Cervical Position: Posterior Station: -3 Presentation: Vertex Exam by:: dr Omer Jackmumaw   FHT: 140bpm, mod var, +accels, no decels TOCO: Rare   A/P: Start pitocin No severe range BPs  Continue expectant management Anticipate SVD

## 2017-05-30 NOTE — MAU Note (Signed)
MD sent pt in for elevated b/p's. Denies h/a or visual changes. Reports good fetal movement.

## 2017-05-30 NOTE — Anesthesia Pain Management Evaluation Note (Signed)
  CRNA Pain Management Visit Note  Patient: Ariel Turner, 26 y.o., female  "Hello I am a member of the anesthesia team at [facility]. We have an anesthesia team available at all times to provide care throughout the hospital, including epidural management and anesthesia for C-section. I don't know your plan for the delivery whether it a natural birth, water birth, IV sedation, nitrous supplementation, doula or epidural, but we want to meet your pain goals."   1.Was your pain managed to your expectations on prior hospitalizations?   Yes   2.What is your expectation for pain management during this hospitalization?     Labor support without medications  3.How can we help you reach that goal?   Record the patient's initial score and the patient's pain goal.   Pain: 0  Pain Goal: 10 [facility] wants you to be able to say your pain was always managed very well.  Ariel Turner,Ariel Turner 05/30/2017

## 2017-05-30 NOTE — Progress Notes (Signed)
G2P1001 at 40/5 (LMP=U=6) Pregnancy complicated by, history of gestational hypertension, mild transaminitis ( AST 63), resolved on last check S: No contractions, Loss of fluid, or vaginal bleeding, baby has been moving well O: See tab  A/P IUP at 40/5, Blood pressure elevated  Routine OB: GBS neg, GC/CT neg, Increased fetal heart rate variability- heart rate varied form 140 to 115, will be monitored while admitted at the MAU Gestaional hypertension: Blood pressure elevated to 148/90 and on repeat 15 mins later was 138/100, no changes in vision, headache, RUQ pain, swelling PP: boy- desires circ, considering contraception  Patient sent to the MAU induction for persistently elevated BPs. Discussed with the OB attending on call  Harmoney Sienkiewicz A. Kennon RoundsHaney MD, MS Family Medicine Resident PGY-3 Pager [phone]

## 2017-05-31 ENCOUNTER — Encounter (HOSPITAL_COMMUNITY): Payer: Self-pay | Admitting: *Deleted

## 2017-05-31 DIAGNOSIS — O1414 Severe pre-eclampsia complicating childbirth: Secondary | ICD-10-CM

## 2017-05-31 DIAGNOSIS — Z3A40 40 weeks gestation of pregnancy: Secondary | ICD-10-CM

## 2017-05-31 LAB — RPR: RPR Ser Ql: NONREACTIVE

## 2017-05-31 MED ORDER — ZOLPIDEM TARTRATE 5 MG PO TABS: 5.0000 mg | ORAL_TABLET | Freq: Every evening | ORAL | Status: DC | PRN

## 2017-05-31 MED ORDER — MAGNESIUM SULFATE 40 G IN LACTATED RINGERS - SIMPLE
2.0000 g/h | INTRAVENOUS | Status: DC
  Administered 2017-05-31: 2 g/h via INTRAVENOUS
  Filled 2017-05-31: qty 40

## 2017-05-31 MED ORDER — TETANUS-DIPHTH-ACELL PERTUSSIS 5-2.5-18.5 LF-MCG/0.5 IM SUSP: 0.5000 mL | Freq: Once | INTRAMUSCULAR | Status: DC

## 2017-05-31 MED ORDER — PRENATAL MULTIVITAMIN CH
1.0000 | ORAL_TABLET | Freq: Every day | ORAL | Status: DC
  Administered 2017-05-31 – 2017-06-01 (×2): 1 via ORAL
  Filled 2017-05-31 (×2): qty 1

## 2017-05-31 MED ORDER — HYDRALAZINE HCL 20 MG/ML IJ SOLN: 10.0000 mg | Freq: Once | INTRAMUSCULAR | Status: DC | PRN

## 2017-05-31 MED ORDER — BENZOCAINE-MENTHOL 20-0.5 % EX AERO: 1.0000 "application " | INHALATION_SPRAY | CUTANEOUS | Status: DC | PRN

## 2017-05-31 MED ORDER — SODIUM CHLORIDE 0.9% FLUSH: 3.0000 mL | INTRAVENOUS | Status: DC | PRN

## 2017-05-31 MED ORDER — MAGNESIUM SULFATE 40 G IN LACTATED RINGERS - SIMPLE
2.0000 g/h | INTRAVENOUS | Status: AC
  Administered 2017-05-31: 2 g/h via INTRAVENOUS
  Filled 2017-05-31: qty 40
  Filled 2017-05-31: qty 500

## 2017-05-31 MED ORDER — COCONUT OIL OIL: 1.0000 "application " | TOPICAL_OIL | Status: DC | PRN

## 2017-05-31 MED ORDER — SIMETHICONE 80 MG PO CHEW: 80.0000 mg | CHEWABLE_TABLET | ORAL | Status: DC | PRN

## 2017-05-31 MED ORDER — IBUPROFEN 600 MG PO TABS
600.0000 mg | ORAL_TABLET | Freq: Four times a day (QID) | ORAL | Status: DC
  Administered 2017-05-31 – 2017-06-02 (×8): 600 mg via ORAL
  Filled 2017-05-31 (×8): qty 1

## 2017-05-31 MED ORDER — ACETAMINOPHEN 325 MG PO TABS: 650.0000 mg | ORAL_TABLET | ORAL | Status: DC | PRN

## 2017-05-31 MED ORDER — SODIUM CHLORIDE 0.9 % IV SOLN: 250.0000 mL | INTRAVENOUS | Status: DC | PRN

## 2017-05-31 MED ORDER — DIBUCAINE 1 % RE OINT
1.0000 | TOPICAL_OINTMENT | RECTAL | Status: DC | PRN
Start: 2017-05-31 — End: 2017-06-02

## 2017-05-31 MED ORDER — ONDANSETRON HCL 4 MG/2ML IJ SOLN
4.0000 mg | INTRAMUSCULAR | Status: DC | PRN
Start: 2017-05-31 — End: 2017-06-02

## 2017-05-31 MED ORDER — SODIUM CHLORIDE 0.9% FLUSH
3.0000 mL | Freq: Two times a day (BID) | INTRAVENOUS | Status: DC
  Administered 2017-06-01: 3 mL via INTRAVENOUS

## 2017-05-31 MED ORDER — ONDANSETRON HCL 4 MG PO TABS: 4.0000 mg | ORAL_TABLET | ORAL | Status: DC | PRN

## 2017-05-31 MED ORDER — DIPHENHYDRAMINE HCL 25 MG PO CAPS: 25.0000 mg | ORAL_CAPSULE | Freq: Four times a day (QID) | ORAL | Status: DC | PRN

## 2017-05-31 MED ORDER — LABETALOL HCL 5 MG/ML IV SOLN: 20.0000 mg | INTRAVENOUS | Status: DC | PRN

## 2017-05-31 MED ORDER — MAGNESIUM SULFATE BOLUS VIA INFUSION
4.0000 g | Freq: Once | INTRAVENOUS | Status: AC
  Administered 2017-05-31: 4 g via INTRAVENOUS
  Filled 2017-05-31: qty 500

## 2017-05-31 MED ORDER — WITCH HAZEL-GLYCERIN EX PADS: 1.0000 "application " | MEDICATED_PAD | CUTANEOUS | Status: DC | PRN

## 2017-05-31 MED ORDER — OXYTOCIN 40 UNITS IN LACTATED RINGERS INFUSION - SIMPLE MED: 2.5000 [IU]/h | INTRAVENOUS | Status: DC | PRN

## 2017-05-31 MED ORDER — SENNOSIDES-DOCUSATE SODIUM 8.6-50 MG PO TABS
2.0000 | ORAL_TABLET | ORAL | Status: DC
  Administered 2017-06-01: 2 via ORAL
  Filled 2017-05-31 (×2): qty 2

## 2017-05-31 MED ORDER — LACTATED RINGERS IV SOLN
INTRAVENOUS | Status: DC
  Administered 2017-05-31 – 2017-06-01 (×3): via INTRAVENOUS

## 2017-05-31 NOTE — Lactation Note (Signed)
This note was copied from a baby's chart. Lactation Consultation Note  Patient Name: Boy Loraine Lericheallely Soto Becerril JYNWG'NToday's Date: 05/31/2017 Reason for consult: Initial assessment Breastfeeding consultation services and support information given and reviewed.  Baby is 6 hours old and has been to the breast twice and spoon fed once.  Baby currently sleeping in mom's arms.  Instructed on feeding cues and to offer breast with any cue.  Encouraged to call for assist/concerns prn.  Mom states she breastfed her first baby but needed to supplement due to a low milk supply.  I explained we will be monitoring output and weights.  Maternal Data Has patient been taught Hand Expression?: Yes Does the patient have breastfeeding experience prior to this delivery?: Yes  Feeding Feeding Type: Breast Fed Length of feed: 5 min  LATCH Score/Interventions Latch: Repeated attempts needed to sustain latch, nipple held in mouth throughout feeding, stimulation needed to elicit sucking reflex. Intervention(s): Adjust position;Assist with latch;Breast massage  Audible Swallowing: A few with stimulation Intervention(s): Skin to skin;Hand expression  Type of Nipple: Everted at rest and after stimulation  Comfort (Breast/Nipple): Soft / non-tender     Hold (Positioning): Full assist, staff holds infant at breast  LATCH Score: 6  Lactation Tools Discussed/Used     Consult Status Consult Status: Follow-up Date: 06/01/17 Follow-up type: In-patient    Huston FoleyMOULDEN, Ikeem Cleckler S 05/31/2017, 11:46 AM

## 2017-06-01 MED ORDER — AMLODIPINE BESYLATE 5 MG PO TABS
5.0000 mg | ORAL_TABLET | Freq: Every day | ORAL | Status: DC
  Administered 2017-06-01: 5 mg via ORAL
  Filled 2017-06-01: qty 1

## 2017-06-01 MED ORDER — HYDROCHLOROTHIAZIDE 25 MG PO TABS: 25.0000 mg | ORAL_TABLET | Freq: Every day | ORAL | Status: DC

## 2017-06-01 NOTE — Progress Notes (Signed)
CSW [facility]  Upon chart review, CSW notes that [facility] to 28 weeks and had more than 2 visits.  Therefore, she does not meet criteria for automatic CSW consult and infant drug screening and CSW is screening out referral.  Please call CSW if current concerns arise or by MOB's request.

## 2017-06-01 NOTE — Lactation Note (Signed)
This note was copied from a baby's chart. Lactation Consultation Note  Patient Name: Ariel Turner DGNPH'Q Date: 06/01/2017   Mom's anatomy is suggestive of IGT. She reports low milk supply with her 1st child. She pumped for 6 months with 1st child & would only get a total of 3m (total) per pumping session. Mom does not remember her milk coming to volume with her 1st child or breast changes with this or her 1st pregnancy.   Mom understands that we can sometimes "challenge" the breast tissue to make more milk by pumping in the first few days, but long-term, she will still likely have low milk supply. Mom was agreeable to being set-up w/a DEBP to use whenever formula is provided. Mom was shown how to assemble & use hand pump that was included in pump kit. Size 24 flanges are appropriate for her.   RMatthias HughsHLarabida Children[facility] Hospital6/27/2018, 11:45 AM

## 2017-06-01 NOTE — Progress Notes (Signed)
POSTPARTUM PROGRESS NOTE  Post Partum Day 1 Subjective:  Ariel Turner is a 26 y.o. [id number] 5336w6d s/p SVD after induction of labor for severe Pre-eclampsia.  No acute events overnight.  Pt denies problems with ambulating, voiding or po intake.  She denies nausea or vomiting.  Pain is well controlled.  She has had flatus. She has not had bowel movement.  Lochia Small.   Objective: Blood pressure (!) 133/97, pulse 72, temperature 98.2 F (36.8 C), temperature source Oral, resp. rate 17, height 5\' 3"  (1.6 m), weight 216 lb (98 kg), last menstrual period 08/18/2016, SpO2 99 %, unknown if currently breastfeeding.  Physical Exam:  General: alert, cooperative and no distress Lochia:normal flow Chest: no respiratory distress Heart:regular rate, distal pulses intact Abdomen: soft, nontender,  Uterine Fundus: firm, appropriately tender DVT Evaluation: No calf swelling or tenderness Extremities: +1 edema   Recent Labs  05/30/17 1425  HGB 8.3*  HCT 27.5*    Assessment/Plan:  ASSESSMENT: Ariel Turner is a 26 y.o. [id number] 3936w6d s/p SVD after IOL for severe Pre-eclampsia  #1Severe pre-eclampsia: Mag off this AM. No symptoms. Start Norvasc 10mg .  Plan for discharge tomorrow and Breastfeeding   LOS: 2 days   Les Pouicholas SchenkMD 06/01/2017, 10:11 AM

## 2017-06-02 MED ORDER — AMLODIPINE BESYLATE 10 MG PO TABS
10.0000 mg | ORAL_TABLET | Freq: Every day | ORAL | Status: DC
  Administered 2017-06-02: 10 mg via ORAL
  Filled 2017-06-02: qty 1

## 2017-06-02 MED ORDER — AMLODIPINE BESYLATE 10 MG PO TABS: 10.0000 mg | ORAL_TABLET | Freq: Every day | ORAL | 0 refills | Status: DC

## 2017-06-02 MED ORDER — IBUPROFEN 600 MG PO TABS: 600.0000 mg | ORAL_TABLET | Freq: Four times a day (QID) | ORAL | 0 refills | Status: DC

## 2017-06-02 MED ORDER — SENNOSIDES-DOCUSATE SODIUM 8.6-50 MG PO TABS: 1.0000 | ORAL_TABLET | Freq: Every evening | ORAL | 0 refills | Status: DC | PRN

## 2017-06-02 NOTE — Lactation Note (Signed)
This note was copied from a baby's chart. Lactation Consultation Note  Patient Name: Ariel Turner ZOXWR'UToday's Date: 06/02/2017 Reason for consult: Follow-up assessment   With this mom of a term baby, now 452 hours old. Mom is breast and formula feeding, putting baby to breast first. Mom did hand expression for me, and was able to easily express transitional milk. Mom states her breasts feel fuller today. I reviewed with normal amounts of wet and dirty diapers the baby should have in the next few days, as a guide to what the baby is taking in. I reviewed formula storage and mixing with parents, gave mom a manual hand pump and instructed her in it's use. Mom has a DEP Medela pump at home, and knows that some pumping should increase her milk supply. At this time, mom is just breast feeding, and supplementing the baby if he is not satiated after the breast. Mom knows to call for questions/conerns.    Maternal Data    Feeding Feeding Type: Breast Fed Length of feed: 15 min  LATCH Score/Interventions                      Lactation Tools Discussed/Used     Consult Status Consult Status: Complete Follow-up type: Call as needed    Alfred LevinsLee, [name] Anne 06/02/2017, 9:11 AM

## 2017-06-02 NOTE — Discharge Summary (Signed)
OB Discharge Summary     Patient Name: Ariel Turner DOB: Apr 14, 1991 MRN: [id number]  Date of admission: 05/30/2017 Delivering MD: Michaele Offer   Date of discharge: 06/02/2017  Admitting diagnosis: 41WKS, MD SENT Intrauterine pregnancy: [redacted]w[redacted]d     Secondary diagnosis:  Active Problems:   Preeclampsia  Additional problems:  Patient Active Problem List   Diagnosis Date Noted  . Gestational hypertension 05/30/2017  . Preeclampsia 05/30/2017  . Cervical polyp 02/16/2017  . Shortness of breath 12/20/2016  . Supervision of normal pregnancy 10/05/2016  . 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 yo [id number] at [redacted]w[redacted]d was admitted to the hospital 05/30/2017 for induction of labor.  Indication for induction: Preeclampsia.  Patient had an uncomplicated labor course as follows: Membrane Rupture Time/Date: 11:20 PM ,05/30/2017   Intrapartum Procedures: Episiotomy: None [1]                                         Lacerations:     Patient had delivery of a Viable infant. She was monitored on magnesium for 24 hours after delivery and started on norvasc 10 mg daily for continued blood pressure control after delivery. Information for the patient's newborn:  Ariel, Turner [[id number]]  Delivery Method: Vag-Spont   05/31/2017  Details of delivery can be found in separate delivery note.  Patient had a routine postpartum course. Patient is discharged home 06/02/17.  Physical exam  Vitals:   06/02/17 0454 06/02/17 0824 06/02/17 1024 06/02/17 1124  BP: (!) 143/89 (!) 140/99 125/80 120/80  Pulse: 79 84 64 73  Resp: 18 18    Temp: 98.6 F (37 C) 98.4 F (36.9 C)    TempSrc: Oral Oral    SpO2: 98%  93%    Weight:      Height:       General: alert, cooperative and no distress Lochia: appropriate Uterine Fundus: firm DVT Evaluation: No evidence of DVT seen on physical exam. Labs: Lab Results  Component Value Date   WBC 9.4 05/30/2017   HGB 8.3 (L) 05/30/2017   HCT 27.5 (L) 05/30/2017   MCV 65.9 (L) 05/30/2017   PLT 244 05/30/2017   CMP Latest Ref Rng & Units 05/30/2017  Glucose 65 - 99 mg/dL 130(Q)  BUN 6 - 20 mg/dL 11  Creatinine 6.57 - 8.46 mg/dL 9.62  Sodium 952 - 841 mmol/L 135  Potassium 3.5 - 5.1 mmol/L 3.7  Chloride 101 - 111 mmol/L 105  CO2 22 - 32 mmol/L 21(L)  Calcium 8.9 - 10.3 mg/dL 9.0  Total Protein 6.5 - 8.1 g/dL 6.7  Total Bilirubin 0.3 - 1.2 mg/dL 0.4  Alkaline Phos  38 - 126 U/L 176(H)  AST 15 - 41 U/L 18  ALT 14 - 54 U/L 11(L)    Discharge instruction: per After Visit Summary and "Baby and Me Booklet".  After visit meds:  Allergies as of 06/02/2017   No Known Allergies     Medication List    TAKE these medications   amLODipine 10 MG tablet Commonly known as:  NORVASC Take 1 tablet (10 mg total) by mouth daily. Start taking on:  06/03/2017   ibuprofen 600 MG tablet Commonly known as:  ADVIL,MOTRIN Take 1 tablet (600 mg total) by mouth every 6 (six) hours.   prenatal multivitamin Tabs tablet Take 1 tablet by mouth daily at 12 noon.   senna-docusate 8.6-50 MG tablet Commonly known as:  Senokot-S Take 1 tablet by mouth at bedtime as needed for mild constipation.       Diet: routine diet  Activity: Advance as tolerated. Pelvic rest for 6 weeks.   Outpatient follow up:6 weeks Follow up Appt: Future Appointments Date Time Provider Department Center  06/09/2017 9:00 AM [facility] NURSE [facility]  06/29/2017 2:45 PM Howard PouchFeng, Lauren, MD [facility]   Follow up Visit:No Follow-up on file.  Postpartum contraception: Progesterone only pills  Newborn Data: Live born female  Birth Weight: 7 lb 6.9 oz (3371 g) APGAR: 9, 9  Patient was  noted to have elevated BPs in 140-150s systolic after coming off magnesium sulfate. She was started on norvasc. Patient was discharged with Norvasc 10 mg. Her blood pressures were normal range at the time of discharge. BP nurse check was scheduled for one week after discharge. Baby love was also ordered at discharge.   Baby Feeding: Bottle and Breast Disposition:home with mother   06/02/2017 Howard PouchLauren Feng, MD   OB FELLOW DISCHARGE ATTESTATION  I have seen and examined this patient and agree with above documentation in the resident's note. Patient's BP is normal today after repeating it. Sent home on BP meds, to have baby love RN seeing patient at house for BP checks and will have an appt in 1 week with [facility] for follow up.   Jen MowElizabeth Darlen Gledhill, DO OB Fellow 9:28 AM

## 2017-06-02 NOTE — Progress Notes (Signed)
Patient discharged home with husband. Discharge teaching, paperwork, home-care, prescriptions, and s/s of preeclampsia reviewed. No questions at this time.

## 2017-06-02 NOTE — Discharge Instructions (Signed)

## 2017-06-09 ENCOUNTER — Ambulatory Visit (INDEPENDENT_AMBULATORY_CARE_PROVIDER_SITE_OTHER): Payer: Self-pay | Admitting: *Deleted

## 2017-06-09 VITALS — BP 132/92 | HR 82 | Temp 98.4°F

## 2017-06-09 DIAGNOSIS — Z0130 Encounter for examination of blood pressure without abnormal findings: Secondary | ICD-10-CM

## 2017-06-09 NOTE — Progress Notes (Signed)
   Patient in nurse clinic for blood pressure check. Patient recently delivered baby last week. Patient had high blood pressure during pregnancy and started on amlodipine. Pt reported she did not take medication yesterday.  Per patient the medication gave her a headache. Pt denies chest pain, SOB, dizziness, visual changes or abdominal pain.  Precept with Dr. Pollie MeyerMcIntyre; patient to continue with blood pressure medication, return precautions given and schedule another blood pressure check on Monday 06/13/17.  Appointment with nurse 06/13/17 at 9 AM.  Clovis PuMartin, Tamika L, RN   Today's Vitals   06/09/17 [phone] 06/09/17 0912  BP: 136/90 (!) 132/92  Pulse: 82   Temp: 98.4 F (36.9 C)   TempSrc: Oral   SpO2: 98%   PainSc: 0-No pain

## 2017-06-09 NOTE — Progress Notes (Signed)
Patient reports not taking blood pressure medication due to headache side effect. As blood pressure is only mildly up in diastolic while off medications, will monitor off amlodipine for now & recheck on Monday. Anticipate blood pressure will improve.  Extensively discussed return precautions for preeclampsia including headache, vision changes, RUQ pain, edema etc which should prompt her to go to [facility] this weekend.  Ariel DodrillBrittany J McIntyre, MD

## 2017-06-13 ENCOUNTER — Ambulatory Visit (INDEPENDENT_AMBULATORY_CARE_PROVIDER_SITE_OTHER): Payer: Self-pay | Admitting: *Deleted

## 2017-06-13 VITALS — BP 132/90 | HR 88

## 2017-06-13 DIAGNOSIS — O133 Gestational [pregnancy-induced] hypertension without significant proteinuria, third trimester: Secondary | ICD-10-CM

## 2017-06-13 NOTE — Progress Notes (Signed)
Patient here today for BP check.    BP today is 132/90.  Checked BP in right arm with regular cuff.  Symptoms present: none. Patient states she was having headaches on the medication previously but this has resolved. Patient last took BP med last night. Patient scheduled for postpartum check in 2 weeks. Routed note to PCP.    Aurther LoftBenton, Keenon Leitzel Frankila, LPN

## 2017-06-29 ENCOUNTER — Encounter: Payer: Self-pay | Admitting: Student in an Organized Health Care Education/Training Program

## 2017-06-29 ENCOUNTER — Ambulatory Visit (INDEPENDENT_AMBULATORY_CARE_PROVIDER_SITE_OTHER): Payer: Medicaid Other | Admitting: Student in an Organized Health Care Education/Training Program

## 2017-06-29 NOTE — Progress Notes (Signed)
   Subjective:    Ariel Turner is a  [id number] female who presents for a postpartum visit. She is 4 weeks postpartum following a vaginal delivery.  She was discharged from the hospital on norvasc 10 mg due to elevated BP/gestational HTN. Postpartum course has been uncomplicated. Baby's course has been uncomplicated. Baby is feeding by breast. Bleeding continue and is very light. Bowel function is normal. Bladder function is normal. Patient is not sexually active. Contraception method is partner to get vasectomy. Postpartum depression screening: negative  The following portions of the patient's history were reviewed and updated as appropriate: allergies, current medications, past medical history, past surgical history and problem list.  Review of Systems Pertinent items are noted in HPI.  Vitals:   06/29/17 1504  BP: 106/70  Pulse: 91  Temp: 98.9 F (37.2 C)     Objective:     General:  alert, cooperative and no distress   Breasts:  deferred, no complaints  Lungs: clear to auscultation bilaterally  Heart:  regular rate and rhythm  Abdomen: soft, nontender        Assessment:   Normal postpartum exam 4 wks s/p NSVD Depression screening negative Contraception counseling   Plan:  Vasectomy for her partner who has an appointment scheduled, and she elects for abstinence in the meantime Blood pressure normal today Discontinue amlodipine 10 mg, follow up in 1 week for nurse visit BP check Follow up at 6 weeks postpartum if continues to have vaginal bleeding  Ariel PouchLauren Lurene Robley, MD,MS,  PGY2 06/30/2017 5:35 AM

## 2017-06-29 NOTE — Patient Instructions (Signed)
Congratulations on your new baby! It was such a pleasure to take care of you in the hospital and see you again in clinic today.  - If you continue to have bleeding at 6 weeks please schedule follow up to come in and be evaluated.  Our clinic's number is [phone]. Please call with questions or concerns about what we discussed today.  Be well, Dr. Mosetta PuttFeng   Perinatal Depression When a woman feels excessive sadness, anger, or anxiety during pregnancy or during the first 12 months after she gives birth, she has a condition called perinatal depression. Depression can interfere with work, school, relationships, and other everyday activities. If it is not managed properly, it can also cause problems in the mother and her baby. Sometimes, perinatal depression is left untreated because symptoms are thought to be normal mood swings during and right after pregnancy. If you have symptoms of depression, it is important to talk with your health care provider. What are the causes? The exact cause of this condition is not known. Hormonal changes during and after pregnancy may play a role in causing perinatal depression. What increases the risk? You are more likely to develop this condition if:  You have a personal or family history of depression, anxiety, or mood disorders.  You experience a stressful life event during pregnancy, such as the death of a loved one.  You have a lot of regular life stress.  You do not have support from family members or loved ones, or you are in an abusive relationship.  What are the signs or symptoms? Symptoms of this condition include:  Feeling sad or hopeless.  Feelings of guilt.  Feeling irritable or overwhelmed.  Changes in your appetite.  Lack of energy or motivation.  Sleep problems.  Difficulty concentrating or completing tasks.  Loss of interest in hobbies or relationships.  Headaches or stomach problems that do not go away.  How is this  diagnosed? This condition is diagnosed based on a physical exam and mental evaluation. In some cases, your health care provider may use a depression screening tool. These tools include a list of questions that can help a health care provider diagnose depression. Your health care provider may refer you to a mental health expert who specializes in depression. How is this treated? This condition may be treated with:  Medicines. Your health care provider will only give you medicines that have been proven safe for pregnancy and breastfeeding.  Talk therapy with a mental health professional to help change your patterns of thinking (cognitive behavioral therapy).  Support groups.  Brain stimulation or light therapies.  Stress reduction therapies, such as mindfulness.  Follow these instructions at home: Lifestyle  Do not use any products that contain nicotine or tobacco, such as cigarettes and e-cigarettes. If you need help quitting, ask your health care provider.  Do not use alcohol when you are pregnant. After your baby is born, limit alcohol intake to no more than 1 drink a day. One drink equals 12 oz of beer, 5 oz of wine, or 1 oz of hard liquor.  Consider joining a support group for new mothers. Ask your health care provider for recommendations.  Take good care of yourself. Make sure you: ? Get plenty of sleep. If you are having trouble sleeping, talk with your health care provider. ? Eat a healthy diet. This includes plenty of fruits and vegetables, whole grains, and lean proteins. ? Exercise regularly, as told by your health care provider. Ask your  health care provider what exercises are safe for you. General instructions  Take over-the-counter and prescription medicines only as told by your health care provider.  Talk with your partner or family members about your feelings during pregnancy. Share any concerns or anxieties that you may have.  Ask for help with tasks or chores when  you need it. Ask friends and family members to provide meals, watch your children, or help with cleaning.  Keep all follow-up visits as told by your health care provider. This is important. Contact a health care provider if:  You (or people close to you) notice that you have any symptoms of depression.  You have depression and your symptoms get worse.  You experience side effects from medicines, such as nausea or sleep problems. Get help right away if:  You feel like hurting yourself, your baby, or someone else. If you ever feel like you may hurt yourself or others, or have thoughts about taking your own life, get help right away. You can go to your nearest emergency department or call:  Your local emergency services (911 in the [place]  A suicide crisis helpline, such as the National Suicide Prevention Lifeline at 302-579-15431-(269)516-2241. This is open 24 hours a day.  Summary  Perinatal depression is when a woman feels excessive sadness, anger, or anxiety during pregnancy or during the first 12 months after she gives birth.  If perinatal depression is not treated, it can lead to health problems for the mother and her baby.  This condition is treated with medicines, talk therapy, stress reduction therapies, or a combination of two or more treatments.  Talk with your partner or family members about your feelings. Do not be afraid to ask for help. This information is not intended to replace advice given to you by your health care provider. Make sure you discuss any questions you have with your health care provider. Document Released: 01/19/2017 Document Revised: 01/19/2017 Document Reviewed: 01/19/2017 Elsevier Interactive Patient Education  [employer]2018 [employer] Inc.

## 2017-07-05 ENCOUNTER — Telehealth: Payer: Self-pay | Admitting: Family Medicine

## 2017-07-05 NOTE — Telephone Encounter (Signed)
Attempted to reach patient to discuss her cervical polyp, as I noted that on her recent postpartum follow up visit a pelvic exam wasn't done to reassess the polyp. She'll need to come for an appointment to have a pelvic exam done.  No answer. Left voicemail asking her to call back. Latrelle DodrillBrittany J Kaylynne Andres, MD

## 2017-07-14 ENCOUNTER — Emergency Department (HOSPITAL_COMMUNITY): Payer: Medicaid Other

## 2017-07-14 ENCOUNTER — Encounter (HOSPITAL_COMMUNITY): Payer: Self-pay | Admitting: Emergency Medicine

## 2017-07-14 ENCOUNTER — Emergency Department (HOSPITAL_COMMUNITY)
Admission: EM | Admit: 2017-07-14 | Discharge: 2017-07-14 | Disposition: A | Discharge status: Home / Self Care | Payer: Medicaid Other | Attending: Emergency Medicine | Admitting: Emergency Medicine

## 2017-07-14 DIAGNOSIS — Z79899 Other long term (current) drug therapy: Secondary | ICD-10-CM | POA: Insufficient documentation

## 2017-07-14 DIAGNOSIS — Z87891 Personal history of nicotine dependence: Secondary | ICD-10-CM | POA: Insufficient documentation

## 2017-07-14 DIAGNOSIS — K8051 Calculus of bile duct without cholangitis or cholecystitis with obstruction: Secondary | ICD-10-CM | POA: Insufficient documentation

## 2017-07-14 DIAGNOSIS — K8050 Calculus of bile duct without cholangitis or cholecystitis without obstruction: Secondary | ICD-10-CM

## 2017-07-14 DIAGNOSIS — R079 Chest pain, unspecified: Secondary | ICD-10-CM | POA: Diagnosis present

## 2017-07-14 LAB — CBC
HEMATOCRIT: 30.2 % — AB (ref 36.0–46.0)
Hemoglobin: 8.8 g/dL — ABNORMAL LOW (ref 12.0–15.0)
MCH: 19 pg — ABNORMAL LOW (ref 26.0–34.0)
MCHC: 29.1 g/dL — AB (ref 30.0–36.0)
MCV: 65.4 fL — ABNORMAL LOW (ref 78.0–100.0)
PLATELETS: 321 10*3/uL (ref 150–400)
RBC: 4.62 MIL/uL (ref 3.87–5.11)
RDW: 17.4 % — AB (ref 11.5–15.5)
WBC: 7.6 10*3/uL (ref 4.0–10.5)

## 2017-07-14 LAB — BASIC METABOLIC PANEL
Anion gap: 9 (ref 5–15)
BUN: 10 mg/dL (ref 6–20)
CALCIUM: 9 mg/dL (ref 8.9–10.3)
CO2: 25 mmol/L (ref 22–32)
CREATININE: 0.67 mg/dL (ref 0.44–1.00)
Chloride: 105 mmol/L (ref 101–111)
GFR calc Af Amer: >60 mL/min (ref >60)
GLUCOSE: 110 mg/dL — AB (ref 65–99)
Potassium: 3.5 mmol/L (ref 3.5–5.1)
Sodium: 139 mmol/L (ref 135–145)

## 2017-07-14 LAB — I-STAT TROPONIN, ED: TROPONIN I, POC: 0 ng/mL (ref 0.00–0.08)

## 2017-07-14 LAB — I-STAT BETA HCG BLOOD, ED (MC, WL, AP ONLY): I-stat hCG, quantitative: <5 m[IU]/mL (ref <5)

## 2017-07-14 LAB — HEPATIC FUNCTION PANEL
ALT: 19 U/L (ref 14–54)
AST: 25 U/L (ref 15–41)
Albumin: 3.5 g/dL (ref 3.5–5.0)
Alkaline Phosphatase: 92 U/L (ref 38–126)
Bilirubin, Direct: <0.1 mg/dL — ABNORMAL LOW (ref 0.1–0.5)
Total Bilirubin: 0.5 mg/dL (ref 0.3–1.2)
Total Protein: 6.8 g/dL (ref 6.5–8.1)

## 2017-07-14 LAB — LIPASE, BLOOD: Lipase: 34 U/L (ref 11–51)

## 2017-07-14 MED ORDER — ONDANSETRON 8 MG PO TBDP: 8.0000 mg | ORAL_TABLET | Freq: Three times a day (TID) | ORAL | 0 refills | Status: DC | PRN

## 2017-07-14 MED ORDER — MORPHINE SULFATE (PF) 4 MG/ML IV SOLN
4.0000 mg | Freq: Once | INTRAVENOUS | Status: AC
  Administered 2017-07-14: 4 mg via INTRAVENOUS
  Filled 2017-07-14: qty 1

## 2017-07-14 MED ORDER — GI COCKTAIL ~~LOC~~
30.0000 mL | Freq: Once | ORAL | Status: AC
  Administered 2017-07-14: 30 mL via ORAL
  Filled 2017-07-14: qty 30

## 2017-07-14 MED ORDER — SODIUM CHLORIDE 0.9 % IV BOLUS (SEPSIS)
1000.0000 mL | Freq: Once | INTRAVENOUS | Status: AC
  Administered 2017-07-14: 1000 mL via INTRAVENOUS

## 2017-07-14 MED ORDER — ONDANSETRON HCL 4 MG/2ML IJ SOLN
4.0000 mg | Freq: Once | INTRAMUSCULAR | Status: AC
  Administered 2017-07-14: 4 mg via INTRAVENOUS
  Filled 2017-07-14: qty 2

## 2017-07-14 NOTE — ED Provider Notes (Signed)
[facility] Provider Note   CSN: [id number] Arrival date & time: 07/14/17  0541     History   Chief Complaint Chief Complaint  Patient presents with  . Chest Pain    HPI Ariel Turner is a 26 y.o. female.  HPI Ariel Turner is a 26 y.o. female history of gestational hypertension, status post vaginal delivery one month ago, with no complications, presents to emergency department complaining of abdominal pain. Patient states that she developed upper abdominal pain around 1 AM this morning. States it is radiating into her back. Describes pain as sharp. Reports associated nausea and multiple episodes of vomiting. Similar pain several weeks ago while pregnant. Denies any blood in her emesis. Normal stools. No urinary symptoms. No fever or chills. She has not tried anything for her symptoms prior to coming in. Denies any prior abdominal surgeries. Admits to poor diet in the last few weeks. Breast-feeding. No other complaints.  Past Medical History:  Diagnosis Date  . Anemia   . Heart murmur    during infancy  . Pregnancy induced hypertension     Patient Active Problem List   Diagnosis Date Noted  . Gestational hypertension 05/30/2017  . Preeclampsia 05/30/2017  . Cervical polyp 02/16/2017  . Shortness of breath 12/20/2016  . Supervision of normal pregnancy 10/05/2016  . 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 NO PAST SURGERIES      OB History    Gravida Para Term Preterm AB Living   2 2 2     2    SAB TAB Ectopic Multiple Live Births         0 2       Home Medications    Prior to Admission medications   Medication Sig Start Date End Date Taking? Authorizing Provider  amLODipine (NORVASC) 10 MG tablet Take 1 tablet (10 mg total) by mouth daily. 06/03/17   Howard PouchFeng, Lauren, MD  ibuprofen (ADVIL,MOTRIN) 600 MG tablet Take 1 tablet (600 mg total) by mouth every 6 (six) hours.  06/02/17   Howard PouchFeng, Lauren, MD  Prenatal Vit-Fe Fumarate-FA (PRENATAL MULTIVITAMIN) TABS tablet Take 1 tablet by mouth daily at 12 noon.    [provider]  senna-docusate (SENOKOT-S) 8.6-50 MG tablet Take 1 tablet by mouth at bedtime as needed for mild constipation. 06/02/17   Howard PouchFeng, Lauren, MD    Family History Family History  Problem Relation Age of Onset  . Thyroid disease Mother   . Hypertension Father   . Diabetes Maternal Grandmother   . Hypertension Maternal Grandmother   . Cancer Maternal Grandfather        lung    Social History Social History  Substance Use Topics  . Smoking status: Former Smoker    Quit date: 2013  . Smokeless tobacco: Never Used     Comment: quit prior to 1st preg  . Alcohol use No     Allergies   Patient has no known allergies.   Review of Systems Review of Systems  Constitutional: Negative for chills and fever.  Respiratory: Negative for cough, chest tightness and shortness of breath.   Cardiovascular: Negative for chest pain, palpitations and leg swelling.  Gastrointestinal: Positive for abdominal pain, nausea and vomiting. Negative for diarrhea.  Genitourinary: Negative for dysuria, flank pain, pelvic pain, vaginal bleeding, vaginal discharge and vaginal pain.  Musculoskeletal: Negative for arthralgias, myalgias, neck pain and neck stiffness.  Skin: Negative for rash.  Neurological: Negative for dizziness, weakness and headaches.  All other systems reviewed and are negative.    Physical Exam Updated Vital Signs BP (!) 152/97   Pulse 76   Temp 99.1 F (37.3 C) (Oral)   Resp 18   LMP 06/27/2017   SpO2 100%   Physical Exam  Constitutional: She appears well-developed and well-nourished. No distress.  HENT:  Head: Normocephalic.  Eyes: Conjunctivae are normal.  Neck: Neck supple.  Cardiovascular: Normal rate, regular rhythm and normal heart sounds.   Pulmonary/Chest: Effort normal and breath sounds normal. No respiratory  distress. She has no wheezes. She has no rales.  Abdominal: Soft. Bowel sounds are normal. She exhibits no distension. There is tenderness. There is no rebound.  Epigastric and right upper quadrant tenderness. Positive Murphy sign  Musculoskeletal: She exhibits no edema.  Neurological: She is alert.  Skin: Skin is warm and dry.  Psychiatric: She has a normal mood and affect. Her behavior is normal.  Nursing note and vitals reviewed.    ED Treatments / Results  Labs (all labs ordered are listed, but only abnormal results are displayed) Labs Reviewed  BASIC METABOLIC PANEL - Abnormal; Notable for the following:       Result Value   Glucose, Bld 110 (*)    All other components within normal limits  CBC - Abnormal; Notable for the following:    Hemoglobin 8.8 (*)    HCT 30.2 (*)    MCV 65.4 (*)    MCH 19.0 (*)    MCHC 29.1 (*)    RDW 17.4 (*)    All other components within normal limits  HEPATIC FUNCTION PANEL - Abnormal; Notable for the following:    Bilirubin, Direct <0.1 (*)    All other components within normal limits  LIPASE, BLOOD  I-STAT TROPONIN, ED  I-STAT BETA HCG BLOOD, ED ([facility], [facility], [facility] ONLY)    EKG  EKG Interpretation None       Radiology Dg Chest 2 View  Result Date: 07/14/2017 CLINICAL DATA:  Acute onset of central chest pain. Nausea and vomiting. Initial encounter. EXAM: CHEST  2 VIEW COMPARISON:  None. FINDINGS: The lungs are well-aerated and clear. There is no evidence of focal opacification, pleural effusion or pneumothorax. The heart is normal in size; the mediastinal contour is within normal limits. No acute osseous abnormalities are seen. IMPRESSION: No acute cardiopulmonary process seen. Electronically Signed   By: Roanna Raider M.D.   On: 07/14/2017 06:28    Procedures Procedures (including critical care time)  Medications Ordered in ED Medications  sodium chloride 0.9 % bolus 1,000 mL (not administered)  ondansetron (ZOFRAN) injection 4 mg (not  administered)  morphine 4 MG/ML injection 4 mg (not administered)  gi cocktail (Maalox,Lidocaine,Donnatal) (not administered)     Initial Impression / Assessment and Plan / ED Course  I have reviewed the triage vital signs and the nursing notes.  Pertinent labs & imaging results that were available during my care of the patient were reviewed by me and considered in my medical decision making (see chart for details).     Patient in emergency department with epigastric abdominal pain radiating to the back. Abdomen is soft, no guarding. Positive Murphy sign. Concerning for gallbladder etiology. She is one-month status post vaginal delivery, no complications other than gestational hypertension. Patient's blood pressure is mildly elevated in ED, will reassess after pain management. IV fluids, pain medications, antiemetics ordered. Basic metabolic panel and CBC obtained of front in triage,  will add LFTs, lipase, ultrasound.  Patient LFTs and lipase are normal. Her ultrasound shows cholelithiasis but no cholecystitis or dilation of bile duct. Her pain has somewhat improved, and she would like to go home. I'll discharge her home with [facility] [place] [facility] referral. We discussed diet changes, prescribe Zofran for nausea. Return precautions discussed.  Vitals:   07/14/17 1000 07/14/17 1130 07/14/17 1300 07/14/17 1326  BP: 134/84 136/86 (!) 142/92   Pulse: 67 (!) 57 63   Resp: 20  16 16   Temp:      TempSrc:      SpO2: 100% 95% 100%      Final Clinical Impressions(s) / ED Diagnoses   Final diagnoses:  Biliary colic    New Prescriptions Discharge Medication List as of 07/14/2017  1:14 PM    START taking these medications   Details  ondansetron (ZOFRAN ODT) 8 MG disintegrating tablet Take 1 tablet (8 mg total) by mouth every 8 (eight) hours as needed for nausea or vomiting., Starting Thu 07/14/2017, Print         Kitiara Hintze, [place], PA-C 07/14/17 1830    Vanetta Mulders,  MD 07/15/17 [phone]

## 2017-07-14 NOTE — ED Notes (Signed)
Family member in lobby states pt had to leave for 5 minutes and will come right back, nurse first made aware and instructed pt to check in at front desk when she arrives, room will be held momentarily to see if pt comes back within 10 minutes.

## 2017-07-14 NOTE — ED Triage Notes (Signed)
Patient with chest pain and nausea and vomiting.  She states that it started around 1am.  Patient has vomited once before coming to ED.  Patient states that her pain is sharp in nature.

## 2017-07-14 NOTE — ED Notes (Signed)
Called lab again to add lipase.

## 2017-07-14 NOTE — Discharge Instructions (Signed)
Take tylenol or motrin for pain. Avoid any fatty foods. Zofran for nausea as needed. Follow up with [facility]. Return if worsening.

## 2017-10-10 ENCOUNTER — Encounter: Payer: Self-pay | Admitting: Family Medicine

## 2017-10-10 NOTE — Telephone Encounter (Signed)
Patient never called back. I am sending her a letter asking her to call me  Ariel DodrillBrittany J Vonzell Lindblad, MD

## 2018-02-28 ENCOUNTER — Ambulatory Visit: Payer: Self-pay | Admitting: Family Medicine

## 2018-05-30 ENCOUNTER — Ambulatory Visit: Payer: BLUE CROSS/BLUE SHIELD | Admitting: Family Medicine

## 2018-08-04 ENCOUNTER — Encounter: Payer: BLUE CROSS/BLUE SHIELD | Admitting: Family Medicine
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
## 2018-10-16 ENCOUNTER — Other Ambulatory Visit (HOSPITAL_COMMUNITY)
Admission: RE | Admit: 2018-10-16 | Discharge: 2018-10-16 | Disposition: A | Discharge status: Home / Self Care | Payer: BLUE CROSS/BLUE SHIELD | Source: Ambulatory Visit | Attending: Family Medicine | Admitting: Family Medicine

## 2018-10-16 ENCOUNTER — Other Ambulatory Visit: Payer: Self-pay

## 2018-10-16 ENCOUNTER — Ambulatory Visit: Payer: BLUE CROSS/BLUE SHIELD | Admitting: Family Medicine

## 2018-10-16 VITALS — BP 126/82 | HR 82 | Temp 98.9°F | Wt 219.0 lb

## 2018-10-16 DIAGNOSIS — N841 Polyp of cervix uteri: Secondary | ICD-10-CM | POA: Diagnosis not present

## 2018-10-16 DIAGNOSIS — E669 Obesity, unspecified: Secondary | ICD-10-CM

## 2018-10-16 DIAGNOSIS — Z0000 Encounter for general adult medical examination without abnormal findings: Secondary | ICD-10-CM

## 2018-10-16 DIAGNOSIS — Z113 Encounter for screening for infections with a predominantly sexual mode of transmission: Secondary | ICD-10-CM

## 2018-10-16 DIAGNOSIS — R1032 Left lower quadrant pain: Secondary | ICD-10-CM

## 2018-10-16 DIAGNOSIS — Z23 Encounter for immunization: Secondary | ICD-10-CM

## 2018-10-16 DIAGNOSIS — Z3202 Encounter for pregnancy test, result negative: Secondary | ICD-10-CM | POA: Diagnosis not present

## 2018-10-16 LAB — POCT URINALYSIS DIP (MANUAL ENTRY)
BILIRUBIN UA: NEGATIVE mg/dL
Bilirubin, UA: NEGATIVE
Blood, UA: NEGATIVE
Glucose, UA: NEGATIVE mg/dL
Leukocytes, UA: NEGATIVE
Nitrite, UA: NEGATIVE
Protein Ur, POC: 30 mg/dL — AB
UROBILINOGEN UA: 0.2 U/dL
pH, UA: 6 (ref 5.0–8.0)

## 2018-10-16 LAB — POCT URINE PREGNANCY: Preg Test, Ur: NEGATIVE

## 2018-10-16 NOTE — Progress Notes (Signed)
Date of Visit: 10/16/2018   HPI:  Patient presents today for a well woman exam.   Concerns today: needs cervical polyp re-examined, never came back to have this done after delivery of her child 16 months ago Periods: monthly periods Contraception: husb had vasectomy Pelvic symptoms: no discharge or abnormal bleeding, does have left sided pelvic pain off & on for the last several weeks. LMP Friday was normal, still on period now. Pain feels like cramping. Happening 5 or so days out of the week, intermittently throughout the day. STD Screening: would like to be tested today Pap smear status: current on pap smear Exercise: not much Diet: eats lots of fast food. Is unhappy with how much she weighs. Would like to meet with nutritionist. Smoking: no Alcohol: no Drugs: no Mood: denies feeling depressed. Tearful about her weight. Would like to discuss coping skills with [facility] but not able to talk with them today. Dentist: has not been  ROS: See HPI  PMFSH:  Cancers in family: grandfather with lung cancer, he was a smoker  PHYSICAL EXAM: BP 126/82   Pulse 82   Temp 98.9 F (37.2 C) (Oral)   Wt 219 lb (99.3 kg)   SpO2 99%   BMI 38.79 kg/m  Gen: NAD, pleasant, cooperative HEENT: NCAT, PERRL Lungs: NWOB Abdomen: soft, nontender to palpation, normoactive bowel sounds  Neuro: grossly nonfocal, speech normal GU: normal appearing external genitalia without lesions. Vagina is moist with white discharge. Cervix not fully visualized despite changing to longer speculum. No cervical motion tenderness. Some left adnexal tenderness but no masses appreciated.   ASSESSMENT/PLAN:  Health maintenance:  -STD screening: gc/chl ordered today -pap smear: current -immunizations: flu shot today -handout given on health maintenance topics  Cervical polyp Unfortunately I was not able to see patient's cervix today, despite repositioning her and changing to a longer speculum. I have asked her to schedule  in GYN clinic for another attempt at visualizing previously noted cervical polyp. If necessary polyp can be removed at that visit.  Obesity Patient motivated to lose weight and willing to meet with nutritionist. Gave contact info for Dr. Gerilyn Pilgrim, instructed patient to call & schedule appointment.    LLQ pain: Not reason for visit, but noted on ROS. Mild tenderness noted on bimanual exam. Suggested obtaining pelvic ultrasound to evaluated L adnexa/ovary but patient prefers to hold off on this at this time. We did obtain a urine pregnancy test which was negative, UA also without signs of infection. Checking gc/chlamydia. Since I was not able to see her cervix well despite changing speculums, I am going to have her be seen in our GYN clinic. If LLQ pain persisting at that appointment would recommend scheduling pelvic ultrasound.  Will message [facility] pool to ask them to call patient & schedule visit as she would like to meet with a counselor.  FOLLOW UP: Follow up in GYN clinic for cervical polyp eval Schedule with Dr. Gerilyn Pilgrim Will have [facility] contact patient   [place]. Pollie Meyer, MD [facility]

## 2018-10-16 NOTE — Patient Instructions (Addendum)
Schedule in women's health clinic to have a pelvic exam to get a good look at your cervix, also to follow up on the pain in your left lower belly.  For weight management: Call Dr. Jenne Campus (our nutritionist) to set up an appointment. Her phone number is: [phone].  I'll have the behavioral health counselors call you to set up a time to meet.  Be well, Dr. Ardelia Mems   Health Maintenance, Female Adopting a healthy lifestyle and getting preventive care can go a long way to promote health and wellness. Talk with your health care provider about what schedule of regular examinations is right for you. This is a good chance for you to check in with your provider about disease prevention and staying healthy. In between checkups, there are plenty of things you can do on your own. Experts have done a lot of research about which lifestyle changes and preventive measures are most likely to keep you healthy. Ask your health care provider for more information. Weight and diet Eat a healthy diet  Be sure to include plenty of vegetables, fruits, low-fat dairy products, and lean protein.  Do not eat a lot of foods high in solid fats, added sugars, or salt.  Get regular exercise. This is one of the most important things you can do for your health. ? Most adults should exercise for at least 150 minutes each week. The exercise should increase your heart rate and make you sweat (moderate-intensity exercise). ? Most adults should also do strengthening exercises at least twice a week. This is in addition to the moderate-intensity exercise.  Maintain a healthy weight  Body mass index (BMI) is a measurement that can be used to identify possible weight problems. It estimates body fat based on height and weight. Your health care provider can help determine your BMI and help you achieve or maintain a healthy weight.  For females 71 years of age and older: ? A BMI below 18.5 is considered underweight. ? A BMI of 18.5 to  24.9 is normal. ? A BMI of 25 to 29.9 is considered overweight. ? A BMI of 30 and above is considered obese.  Watch levels of cholesterol and blood lipids  You should start having your blood tested for lipids and cholesterol at 26 years of age, then have this test every 5 years.  You may need to have your cholesterol levels checked more often if: ? Your lipid or cholesterol levels are high. ? You are older than 26 years of age. ? You are at high risk for heart disease.  Cancer screening Lung Cancer  Lung cancer screening is recommended for adults 37-55 years old who are at high risk for lung cancer because of a history of smoking.  A yearly low-dose CT scan of the lungs is recommended for people who: ? Currently smoke. ? Have quit within the past 15 years. ? Have at least a 30-pack-year history of smoking. A pack year is smoking an average of one pack of cigarettes a day for 1 year.  Yearly screening should continue until it has been 15 years since you quit.  Yearly screening should stop if you develop a health problem that would prevent you from having lung cancer treatment.  Breast Cancer  Practice breast self-awareness. This means understanding how your breasts normally appear and feel.  It also means doing regular breast self-exams. Let your health care provider know about any changes, no matter how small.  If you are in your 17s  or 67s, you should have a clinical breast exam (CBE) by a health care provider every 1-3 years as part of a regular health exam.  If you are 97 or older, have a CBE every year. Also consider having a breast X-ray (mammogram) every year.  If you have a family history of breast cancer, talk to your health care provider about genetic screening.  If you are at high risk for breast cancer, talk to your health care provider about having an MRI and a mammogram every year.  Breast cancer gene (BRCA) assessment is recommended for women who have family  members with BRCA-related cancers. BRCA-related cancers include: ? Breast. ? Ovarian. ? Tubal. ? Peritoneal cancers.  Results of the assessment will determine the need for genetic counseling and BRCA1 and BRCA2 testing.  Cervical Cancer Your health care provider may recommend that you be screened regularly for cancer of the pelvic organs (ovaries, uterus, and vagina). This screening involves a pelvic examination, including checking for microscopic changes to the surface of your cervix (Pap test). You may be encouraged to have this screening done every 3 years, beginning at age 29.  For women ages 9-65, health care providers may recommend pelvic exams and Pap testing every 3 years, or they may recommend the Pap and pelvic exam, combined with testing for human papilloma virus (HPV), every 5 years. Some types of HPV increase your risk of cervical cancer. Testing for HPV may also be done on women of any age with unclear Pap test results.  Other health care providers may not recommend any screening for nonpregnant women who are considered low risk for pelvic cancer and who do not have symptoms. Ask your health care provider if a screening pelvic exam is right for you.  If you have had past treatment for cervical cancer or a condition that could lead to cancer, you need Pap tests and screening for cancer for at least 20 years after your treatment. If Pap tests have been discontinued, your risk factors (such as having a new sexual partner) need to be reassessed to determine if screening should resume. Some women have medical problems that increase the chance of getting cervical cancer. In these cases, your health care provider may recommend more frequent screening and Pap tests.  Colorectal Cancer  This type of cancer can be detected and often prevented.  Routine colorectal cancer screening usually begins at 26 years of age and continues through 26 years of age.  Your health care provider may  recommend screening at an earlier age if you have risk factors for colon cancer.  Your health care provider may also recommend using home test kits to check for hidden blood in the stool.  A small camera at the end of a tube can be used to examine your colon directly (sigmoidoscopy or colonoscopy). This is done to check for the earliest forms of colorectal cancer.  Routine screening usually begins at age 60.  Direct examination of the colon should be repeated every 5-10 years through 26 years of age. However, you may need to be screened more often if early forms of precancerous polyps or small growths are found.  Skin Cancer  Check your skin from head to toe regularly.  Tell your health care provider about any new moles or changes in moles, especially if there is a change in a mole's shape or color.  Also tell your health care provider if you have a mole that is larger than the size of a  pencil eraser.  Always use sunscreen. Apply sunscreen liberally and repeatedly throughout the day.  Protect yourself by wearing long sleeves, pants, a wide-brimmed hat, and sunglasses whenever you are outside.  Heart disease, diabetes, and high blood pressure  High blood pressure causes heart disease and increases the risk of stroke. High blood pressure is more likely to develop in: ? People who have blood pressure in the high end of the normal range (130-139/85-89 mm Hg). ? People who are overweight or obese. ? People who are African American.  If you are 51-22 years of age, have your blood pressure checked every 3-5 years. If you are 68 years of age or older, have your blood pressure checked every year. You should have your blood pressure measured twice-once when you are at a hospital or clinic, and once when you are not at a hospital or clinic. Record the average of the two measurements. To check your blood pressure when you are not at a hospital or clinic, you can use: ? An automated blood pressure  machine at a pharmacy. ? A home blood pressure monitor.  If you are between 50 years and 63 years old, ask your health care provider if you should take aspirin to prevent strokes.  Have regular diabetes screenings. This involves taking a blood sample to check your fasting blood sugar level. ? If you are at a normal weight and have a low risk for diabetes, have this test once every three years after 26 years of age. ? If you are overweight and have a high risk for diabetes, consider being tested at a younger age or more often. Preventing infection Hepatitis B  If you have a higher risk for hepatitis B, you should be screened for this virus. You are considered at high risk for hepatitis B if: ? You were born in a country where hepatitis B is common. Ask your health care provider which countries are considered high risk. ? Your parents were born in a high-risk country, and you have not been immunized against hepatitis B (hepatitis B vaccine). ? You have HIV or AIDS. ? You use needles to inject street drugs. ? You live with someone who has hepatitis B. ? You have had sex with someone who has hepatitis B. ? You get hemodialysis treatment. ? You take certain medicines for conditions, including cancer, organ transplantation, and autoimmune conditions.  Hepatitis C  Blood testing is recommended for: ? Everyone born from [birth year] through 1964. ? Anyone with known risk factors for hepatitis C.  Sexually transmitted infections (STIs)  You should be screened for sexually transmitted infections (STIs) including gonorrhea and chlamydia if: ? You are sexually active and are younger than 26 years of age. ? You are older than 26 years of age and your health care provider tells you that you are at risk for this type of infection. ? Your sexual activity has changed since you were last screened and you are at an increased risk for chlamydia or gonorrhea. Ask your health care provider if you are at  risk.  If you do not have HIV, but are at risk, it may be recommended that you take a prescription medicine daily to prevent HIV infection. This is called pre-exposure prophylaxis (PrEP). You are considered at risk if: ? You are sexually active and do not regularly use condoms or know the HIV status of your partner(s). ? You take drugs by injection. ? You are sexually active with a partner who has HIV.  Talk with your health care provider about whether you are at high risk of being infected with HIV. If you choose to begin PrEP, you should first be tested for HIV. You should then be tested every 3 months for as long as you are taking PrEP. Pregnancy  If you are premenopausal and you may become pregnant, ask your health care provider about preconception counseling.  If you may become pregnant, take 400 to 800 micrograms (mcg) of folic acid every day.  If you want to prevent pregnancy, talk to your health care provider about birth control (contraception). Osteoporosis and menopause  Osteoporosis is a disease in which the bones lose minerals and strength with aging. This can result in serious bone fractures. Your risk for osteoporosis can be identified using a bone density scan.  If you are 26 years of age or older, or if you are at risk for osteoporosis and fractures, ask your health care provider if you should be screened.  Ask your health care provider whether you should take a calcium or vitamin D supplement to lower your risk for osteoporosis.  Menopause may have certain physical symptoms and risks.  Hormone replacement therapy may reduce some of these symptoms and risks. Talk to your health care provider about whether hormone replacement therapy is right for you. Follow these instructions at home:  Schedule regular health, dental, and eye exams.  Stay current with your immunizations.  Do not use any tobacco products including cigarettes, chewing tobacco, or electronic  cigarettes.  If you are pregnant, do not drink alcohol.  If you are breastfeeding, limit how much and how often you drink alcohol.  Limit alcohol intake to no more than 1 drink per day for nonpregnant women. One drink equals 12 ounces of beer, 5 ounces of wine, or 1 ounces of hard liquor.  Do not use street drugs.  Do not share needles.  Ask your health care provider for help if you need support or information about quitting drugs.  Tell your health care provider if you often feel depressed.  Tell your health care provider if you have ever been abused or do not feel safe at home. This information is not intended to replace advice given to you by your health care provider. Make sure you discuss any questions you have with your health care provider. Document Released: 06/07/2011 Document Revised: 04/29/2016 Document Reviewed: 08/26/2015 Elsevier Interactive Patient Education  [employer].

## 2018-10-17 LAB — CERVICOVAGINAL ANCILLARY ONLY
CHLAMYDIA, DNA PROBE: NEGATIVE
NEISSERIA GONORRHEA: NEGATIVE

## 2018-10-19 DIAGNOSIS — E669 Obesity, unspecified: Secondary | ICD-10-CM | POA: Insufficient documentation

## 2018-10-19 NOTE — Assessment & Plan Note (Signed)
Patient motivated to lose weight and willing to meet with nutritionist. Gave contact info for Dr. Gerilyn PilgrimSykes, instructed patient to call & schedule appointment.

## 2018-10-19 NOTE — Assessment & Plan Note (Signed)
Unfortunately I was not able to see patient's cervix today, despite repositioning her and changing to a longer speculum. I have asked her to schedule in GYN clinic for another attempt at visualizing previously noted cervical polyp. If necessary polyp can be removed at that visit.

## 2019-01-13 ENCOUNTER — Encounter (HOSPITAL_BASED_OUTPATIENT_CLINIC_OR_DEPARTMENT_OTHER): Payer: Self-pay | Admitting: *Deleted

## 2019-01-13 ENCOUNTER — Emergency Department (HOSPITAL_BASED_OUTPATIENT_CLINIC_OR_DEPARTMENT_OTHER): Payer: BLUE CROSS/BLUE SHIELD

## 2019-01-13 ENCOUNTER — Emergency Department (HOSPITAL_BASED_OUTPATIENT_CLINIC_OR_DEPARTMENT_OTHER)
Admission: EM | Admit: 2019-01-13 | Discharge: 2019-01-13 | Disposition: A | Discharge status: Home / Self Care | Payer: BLUE CROSS/BLUE SHIELD | Attending: Emergency Medicine | Admitting: Emergency Medicine

## 2019-01-13 ENCOUNTER — Other Ambulatory Visit: Payer: Self-pay

## 2019-01-13 DIAGNOSIS — Y9389 Activity, other specified: Secondary | ICD-10-CM | POA: Insufficient documentation

## 2019-01-13 DIAGNOSIS — W268XXA Contact with other sharp object(s), not elsewhere classified, initial encounter: Secondary | ICD-10-CM | POA: Insufficient documentation

## 2019-01-13 DIAGNOSIS — Y999 Unspecified external cause status: Secondary | ICD-10-CM | POA: Diagnosis not present

## 2019-01-13 DIAGNOSIS — Y92512 Supermarket, store or market as the place of occurrence of the external cause: Secondary | ICD-10-CM | POA: Diagnosis not present

## 2019-01-13 DIAGNOSIS — Z87891 Personal history of nicotine dependence: Secondary | ICD-10-CM | POA: Insufficient documentation

## 2019-01-13 DIAGNOSIS — R58 Hemorrhage, not elsewhere classified: Secondary | ICD-10-CM | POA: Diagnosis not present

## 2019-01-13 DIAGNOSIS — S61210A Laceration without foreign body of right index finger without damage to nail, initial encounter: Secondary | ICD-10-CM | POA: Insufficient documentation

## 2019-01-13 MED ORDER — LIDOCAINE HCL (PF) 1 % IJ SOLN
INTRAMUSCULAR | Status: AC
  Administered 2019-01-13: 5 mL
  Filled 2019-01-13: qty 5

## 2019-01-13 MED ORDER — BACITRACIN ZINC 500 UNIT/GM EX OINT: TOPICAL_OINTMENT | Freq: Once | CUTANEOUS | Status: DC

## 2019-01-13 NOTE — ED Triage Notes (Signed)
Pt states she picked a vase at [employer] and it broke in her hand. Reports laceration to right index finger. Pressure dressing placed pta by EMS

## 2019-01-13 NOTE — ED Notes (Signed)
ED Provider at bedside. Elson Clan, PAc

## 2019-01-13 NOTE — Discharge Instructions (Signed)
Keep the wound clean and dry for the first 24 hours. After that you may gently clean the wound with soap and water. Make sure to pat dry the wound before covering it with any dressing. You can use topical antibiotic ointment and bandage. Ice and elevate for pain relief.   You can take Tylenol or Ibuprofen as directed for pain. You can alternate Tylenol and Ibuprofen every 4 hours for additional pain relief.   As we discussed, some of the numbness that you feel at the end of the finger should resolve but if it does not, I provided you a referral to the outpatient hand doctor for further evaluation.  Return to the Emergency Department, your primary care doctor, or the [facility] in 5-7 days for suture removal.   Monitor closely for any signs of infection. Return to the Emergency Department for any worsening redness/swelling of the area that begins to spread, drainage from the site, worsening pain, fever or any other worsening or concerning symptoms.

## 2019-01-13 NOTE — ED Notes (Signed)
Radiology notified of need for portable xray

## 2019-01-13 NOTE — ED Provider Notes (Signed)
[facility] EMERGENCY DEPARTMENT Provider Note   CSN: [id number] Arrival date & time: 01/13/19  1634     History   Chief Complaint No chief complaint on file.   HPI Ariel Turner is a 28 y.o. female who presents for evaluation of right ankle index finger laceration that occurred just prior to arrival.  [facility] and states she picked up a vase and states that when she picked it up, there was a piece sticking out that slid and cut the lateral aspect of her right index finger.  He states her tetanus was in the last 3 to 4 years.  Patient reports a little bit of decrease sensation to the distal aspect of the finger.  Patient reports she has not taken any medication pain.  She states that finger hurts to move.  Patient denies any weakness.  The history is provided by the patient.    Past Medical History:  Diagnosis Date  . Anemia   . Heart murmur    during infancy  . Pregnancy induced hypertension     Patient Active Problem List   Diagnosis Date Noted  . Obesity 10/19/2018  . Gestational hypertension 05/30/2017  . Preeclampsia 05/30/2017  . Cervical polyp 02/16/2017  . Shortness of breath 12/20/2016  . Supervision of normal pregnancy 10/05/2016  . 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 NO PAST SURGERIES       OB History    Gravida  2   Para  2   Term  2   Preterm      AB      Living  2     SAB      TAB      Ectopic      Multiple  0   Live Births  2            Home Medications    Prior to Admission medications   Not on File    Family History Family History  Problem Relation Age of Onset  . Thyroid disease Mother   . Hypertension Father   . Diabetes Maternal Grandmother   . Hypertension Maternal Grandmother   . Cancer Maternal Grandfather        lung    Social History Social History   Tobacco Use  . Smoking  status: Former Smoker    Last attempt to quit: 2013    Years since quitting: 7.1  . Smokeless tobacco: Never Used  . Tobacco comment: quit prior to 1st preg  Substance Use Topics  . Alcohol use: Not Currently  . Drug use: No     Allergies   Patient has no known allergies.   Review of Systems Review of Systems  Skin: Positive for wound.  Neurological: Positive for numbness.  All other systems reviewed and are negative.    Physical Exam Updated Vital Signs BP (!) 143/90 (BP Location: Left Arm)   Pulse 86   Temp 98.8 F (37.1 C) (Oral)   Resp 16   Ht 5\' 2"  (1.575 m)   Wt 97.5 kg   LMP 12/30/2018 (Approximate)   SpO2 100%   Breastfeeding No   BMI 39.32 kg/m   Physical Exam Vitals signs and nursing note reviewed.  Constitutional:      Appearance: She is well-developed.  HENT:     Head: Normocephalic and atraumatic.  Eyes:  General: No scleral icterus.       Right eye: No discharge.        Left eye: No discharge.     Conjunctiva/sclera: Conjunctivae normal.  Cardiovascular:     Pulses:          Radial pulses are 2+ on the right side and 2+ on the left side.  Pulmonary:     Effort: Pulmonary effort is normal.  Musculoskeletal:     Comments: Flexion/extension of right index finger intact but with some subjective reports of pain.  Flexion/extension of DIP intact when held in isolation.  Skin:    General: Skin is warm and dry.     Capillary Refill: Capillary refill takes less than 2 seconds.     Comments: 2 cm curvilinear wound in the lateral aspect of right index finger. Good distal cap refill.  RUE is not dusky in appearance or cool to touch.  Neurological:     Mental Status: She is alert.     Comments: Mild decrease sensation in his distal tip of the right index finger.  Psychiatric:        Speech: Speech normal.        Behavior: Behavior normal.     ED Treatments / Results  Labs (all labs ordered are listed, but only abnormal results are  displayed) Labs Reviewed - No data to display  EKG None  Radiology Dg Finger Index Right  Result Date: 01/13/2019 CLINICAL DATA:  Laceration to the distal finger EXAM: RIGHT INDEX FINGER 2+V COMPARISON:  None. FINDINGS: Normal anatomic alignment. No evidence for acute fracture or dislocation. Soft tissue injury about the distal index finger. No radiopaque foreign body. IMPRESSION: Soft tissue laceration. No radiopaque foreign body. No acute osseous injury. Electronically Signed   By: Annia Belt M.D.   On: 01/13/2019 18:00    Procedures .Marland KitchenLaceration Repair Date/Time: 01/13/2019 6:24 PM Performed by: Maxwell Caul, PA-C Authorized by: Maxwell Caul, PA-C   Consent:    Consent obtained:  Verbal   Consent given by:  Patient   Risks discussed:  Infection, need for additional repair, pain, poor cosmetic result and poor wound healing   Alternatives discussed:  No treatment and delayed treatment Universal protocol:    Procedure explained and questions answered to patient or proxy's satisfaction: yes     Relevant documents present and verified: yes     Test results available and properly labeled: yes     Imaging studies available: yes     Required blood products, implants, devices, and special equipment available: yes     Site/side marked: yes     Immediately prior to procedure, a time out was called: yes     Patient identity confirmed:  Verbally with patient Anesthesia (see MAR for exact dosages):    Anesthesia method:  Local infiltration   Local anesthetic:  Lidocaine 1% w/o epi Laceration details:    Location:  Finger   Finger location:  L index finger   Length (cm):  2 Repair type:    Repair type:  Intermediate Pre-procedure details:    Preparation:  Patient was prepped and draped in usual sterile fashion and imaging obtained to evaluate for foreign bodies Exploration:    Hemostasis achieved with:  Direct pressure and tourniquet   Wound extent: no foreign bodies/material  noted and no tendon damage noted   Treatment:    Area cleansed with:  Betadine   Amount of cleaning:  Extensive   Irrigation solution:  Sterile water   Irrigation method:  Syringe   Visualized foreign bodies/material removed: no   Skin repair:    Repair method:  Sutures   Suture size:  4-0   Suture material:  Nylon   Suture technique:  Simple interrupted   Number of sutures:  6 Approximation:    Approximation:  Close Post-procedure details:    Dressing:  Antibiotic ointment and non-adherent dressing   Patient tolerance of procedure:  Tolerated well, no immediate complications Comments:     Once the dressing was removed to evaluate wound, it showed a small peripheral artery bleed.  This was controlled with tourniquet and pressure.  Once the finger was anesthetized, was thoroughly and extensively irrigated with sterile water.  No evidence of foreign body.  Once the wound was sutured, patient started using the finger without a dressing applied which caused some bleeding again.  Direct pressure was applied to the wound which stopped the bleeding.  Patient tolerated procedure well.    (including critical care time)  Medications Ordered in ED Medications  bacitracin ointment (has no administration in time range)  lidocaine (PF) (XYLOCAINE) 1 % injection (5 mLs  Given by Other 01/13/19 1718)     Initial Impression / Assessment and Plan / ED Course  I have reviewed the triage vital signs and the nursing notes.  Pertinent labs & imaging results that were available during my care of the patient were reviewed by me and considered in my medical decision making (see chart for details).     Reason-year-old female who presents for evaluation of right index finger laceration that occurred just prior to ED arrival.  She reports that she was picking up a vase and states that part of it slipped and cut the side of her finger.  Patient reports her tetanus is up-to-date.  She does report some mild  numbness noted to distal aspect of the finger. Patient is afebrile, non-toxic appearing, sitting comfortably on examination table. Vital signs reviewed and stable.  Good pulses.  Patient has some decrease sensation of the distal tip of the index finger.  Otherwise neurovascularly intact.  On evaluation of the wound, there appeared to be a small arterial bleed, most likely of the peripheral digital artery.  Tourniquet was applied which stopped bleeding as the wound was sutured.  Laceration repaired as documented above.  Patient tolerated procedure well.  Patient did have some mild bleeding initially afterwards when she started moving the finger prior to bandage.  Pressure dressing was applied.  Wound was reevaluated after pressure dressing which showed bleeding from wound had stopped.  Wound dressing applied.  Encourage patient on at home supportive care measures.  Will give outpatient referral to hand given some distal sensory deficit. At this time, patient exhibits no emergent life-threatening condition that require further evaluation in ED or admission. Patient had ample opportunity for questions and discussion. All patient's questions were answered with full understanding. Strict return precautions discussed. Patient expresses understanding and agreement to plan.   Portions of this note were generated with Scientist, clinical (histocompatibility and immunogenetics). Dictation errors may occur despite best attempts at proofreading.   Final Clinical Impressions(s) / ED Diagnoses   Final diagnoses:  Laceration of right index finger, foreign body presence unspecified, nail damage status unspecified, initial encounter    ED Discharge Orders    None       Rosana Hoes 01/13/19 2116    Melene Plan, DO 01/13/19 2306

## 2019-01-16 ENCOUNTER — Ambulatory Visit: Payer: BLUE CROSS/BLUE SHIELD | Admitting: Family Medicine

## 2019-01-16 VITALS — BP 105/65 | HR 66 | Temp 98.5°F | Wt 218.0 lb

## 2019-01-16 DIAGNOSIS — R809 Proteinuria, unspecified: Secondary | ICD-10-CM

## 2019-01-16 DIAGNOSIS — S61210D Laceration without foreign body of right index finger without damage to nail, subsequent encounter: Secondary | ICD-10-CM | POA: Diagnosis not present

## 2019-01-16 DIAGNOSIS — N63 Unspecified lump in unspecified breast: Secondary | ICD-10-CM | POA: Diagnosis not present

## 2019-01-16 DIAGNOSIS — N841 Polyp of cervix uteri: Secondary | ICD-10-CM | POA: Diagnosis not present

## 2019-01-16 LAB — POCT URINALYSIS DIP (MANUAL ENTRY)
Bilirubin, UA: NEGATIVE
Glucose, UA: NEGATIVE mg/dL
Ketones, POC UA: NEGATIVE mg/dL
NITRITE UA: NEGATIVE
Protein Ur, POC: NEGATIVE mg/dL
RBC UA: NEGATIVE
Spec Grav, UA: 1.02 (ref 1.010–1.025)
Urobilinogen, UA: 0.2 E.U./dL
pH, UA: 7 (ref 5.0–8.0)

## 2019-01-16 LAB — POCT UA - MICROSCOPIC ONLY

## 2019-01-16 NOTE — Progress Notes (Signed)
Date of Visit: 01/16/2019   HPI:  Patient presents for same day appointment to discuss cut on finger and breast issue.  Finger laceration - on Saturday cut her finger on a vase while at [facility].  Was taken to the emergency room via ambulance as it was bleeding a lot.  X-ray showed no foreign body.  It was repaired with interrupted sutures.  She was told to keep the sutures in for a week.  Also was advised to follow-up with hand surgery.  She has not called to schedule that appointment, instead came here.  Has numbness on the ulnar aspect of her right index finger distal to the laceration.  Breast mass: 2 to 3 weeks ago noticed a lump around her right nipple.  No nipple drainage or bleeding.  Not painful.  ROS: See HPI.  PMFSH: previously healthy, history of cervical polyp  PHYSICAL EXAM: BP 105/65   Pulse 66   Temp 98.5 F (36.9 C) (Oral)   Wt 218 lb (98.9 kg)   LMP 01/02/2019   SpO2 99%   BMI 39.87 kg/m  Gen: no acute distress, pleasant, cooperative, well appearing HEENT: normocephalic, atraumatic  Extremities: Laceration to R index finger with sutures in place, no drainage or surrounding erythema, no dehiscence. Breasts: bilateral breasts normal in appearance. No erythema, deformity, or nipple discharge. + mildly enlarged area just superior to R nipple, within the R areola. nontender to palpation. L breast without masses. No axillary lymphadenopathy on either side.   ASSESSMENT/PLAN:  Finger laceration Remains hemostatic without signs of infection.  Does have diminished range of motion, unclear to me if this is due to injury to finger structures or due to pain.  I have placed an urgent referral to hand surgery.  Patient agreeable to follow-up with them.  I did confirm that her Tdap is current. Advised combined tylenol & ibuprofen for pain control (patient does not want anything stronger as she has to work).  Nipple mass On exam this seems more like a [place] tubercle that is  enlarged.  As it is a recent change we will obtain ultrasound to rule out anything worrisome.  I reassured patient that I do not see anything particularly concerning on exam today.  Cervical polyp Reminded patient to schedule an appointment in GYN clinic here to evaluate her cervix since I was previously unable to see it in my clinic.  Proteinuria - noted on prior UA, recheck today  FOLLOW UP: Schedule in GYN clinic Referring to hand surgeon  [place] J. Pollie Meyer, MD [facility]

## 2019-01-16 NOTE — Patient Instructions (Addendum)
Please schedule in our GYN clinic here at the [facility] to follow up on your cervix.  Getting you set up with hand surgeon - someone will call with an appointment  For pain, take one pill of ibuprofen (200mg ) and one pill of tylenol (500mg ) Take both pills at the same time. Do this every 6 hours.   Setting up breast ultrasound  Rechecking urine today  Be well, Dr. Pollie Meyer

## 2019-01-17 ENCOUNTER — Telehealth: Payer: Self-pay

## 2019-01-17 NOTE — Telephone Encounter (Signed)
Called patient and informed her of scheduling appointment with [facility] colposcopy clinic at her convenience.  Glennie Hawk, CMA

## 2019-01-17 NOTE — Telephone Encounter (Signed)
Called patient to inform her of her appointment for Breast ultrasound. Patient states that she is supposed to have an appointment for gynecology as well.  This is not something that I am aware of.  Told patient that I would have to check with Dr. Pollie Meyer.  Order was wrong and was changed by [facility] and was sent back for cosign.  Glennie Hawk, CMA

## 2019-01-17 NOTE — Assessment & Plan Note (Signed)
Reminded patient to schedule an appointment in GYN clinic here to evaluate her cervix since I was previously unable to see it in my clinic.

## 2019-01-17 NOTE — Telephone Encounter (Signed)
She's supposed to schedule with our GYN colpo clinic here at the [facility]. She can schedule this appointment at her convenience.  Thanks Latrelle Dodrill, MD

## 2019-01-22 ENCOUNTER — Ambulatory Visit: Payer: BLUE CROSS/BLUE SHIELD | Admitting: Family Medicine

## 2019-01-22 ENCOUNTER — Encounter: Payer: Self-pay | Admitting: Family Medicine

## 2019-01-22 ENCOUNTER — Other Ambulatory Visit: Payer: Self-pay

## 2019-01-22 VITALS — BP 132/80 | HR 74 | Temp 98.8°F | Ht 62.0 in | Wt 218.0 lb

## 2019-01-22 DIAGNOSIS — Z4802 Encounter for removal of sutures: Secondary | ICD-10-CM

## 2019-01-22 NOTE — Progress Notes (Signed)
Date of Visit: 01/22/2019   HPI:  Patient presents for suture removal of sutures in her finger. These were placed in ED on 2/8 after she sustained a laceration of her finger at [employer]. Still having pain and some numbness. Tylenol/ibuprofen helping some. Has appointment later this week with hand surgeon to evaluate numbness and decreased ROM.  ROS: See HPI.  PMFSH: history of cervical polyp, otherwise healthy  PHYSICAL EXAM: BP 132/80   Pulse 74   Temp 98.8 F (37.1 C) (Oral)   Ht 5\' 2"  (1.575 m)   Wt 218 lb (98.9 kg)   LMP 01/02/2019   SpO2 99%   BMI 39.87 kg/m  Extremities: Laceration to R index finger with sutures in place, no drainage or surrounding erythema, no dehiscence. Wound well approximated and no dehiscence after removal of all six simple interrupted sutures.  ASSESSMENT/PLAN:  Health maintenance:  -UTD on HM items  Finger laceration Sutures removed today Wound well approximated without dehiscence after removal of six sutures Keep appointment with hand surgery  FOLLOW UP: Follow up as needed if symptoms worsen or fail to improve.    [place] J. Pollie Meyer, MD [facility]

## 2019-01-22 NOTE — Patient Instructions (Addendum)
Removed your stitches today Call if any questions or concerns  Be well, Dr. Pollie Meyer

## 2019-01-25 DIAGNOSIS — S61210A Laceration without foreign body of right index finger without damage to nail, initial encounter: Secondary | ICD-10-CM | POA: Diagnosis not present

## 2019-01-25 DIAGNOSIS — M79641 Pain in right hand: Secondary | ICD-10-CM | POA: Diagnosis not present

## 2019-01-26 DIAGNOSIS — S64490A Injury of digital nerve of right index finger, initial encounter: Secondary | ICD-10-CM | POA: Diagnosis not present

## 2019-01-26 DIAGNOSIS — S6401XA Injury of ulnar nerve at wrist and hand level of right arm, initial encounter: Secondary | ICD-10-CM | POA: Diagnosis not present

## 2019-01-29 ENCOUNTER — Other Ambulatory Visit: Payer: BLUE CROSS/BLUE SHIELD

## 2019-02-02 ENCOUNTER — Encounter: Payer: Self-pay | Admitting: Family Medicine

## 2019-02-05 MED ORDER — FLUCONAZOLE 150 MG PO TABS: 150.0000 mg | ORAL_TABLET | Freq: Once | ORAL | 0 refills | Status: AC

## 2019-02-07 DIAGNOSIS — R29898 Other symptoms and signs involving the musculoskeletal system: Secondary | ICD-10-CM | POA: Diagnosis not present

## 2019-02-07 DIAGNOSIS — S61210A Laceration without foreign body of right index finger without damage to nail, initial encounter: Secondary | ICD-10-CM | POA: Diagnosis not present

## 2019-02-07 DIAGNOSIS — M79641 Pain in right hand: Secondary | ICD-10-CM | POA: Diagnosis not present

## 2019-02-14 DIAGNOSIS — R29898 Other symptoms and signs involving the musculoskeletal system: Secondary | ICD-10-CM | POA: Diagnosis not present

## 2019-02-23 ENCOUNTER — Encounter: Payer: Self-pay | Admitting: Family Medicine

## 2019-02-23 NOTE — Telephone Encounter (Signed)
Returned call to patient - addressed in phone note under her children's charts.

## 2019-04-16 ENCOUNTER — Encounter: Payer: Self-pay | Admitting: Family Medicine

## 2019-04-17 NOTE — Telephone Encounter (Signed)
Red team please schedule him for a video visit with me on Thurs morning, unless mom prefers he be seen sooner Thanks Latrelle Dodrill, MD

## 2019-04-24 DIAGNOSIS — Z20828 Contact with and (suspected) exposure to other viral communicable diseases: Secondary | ICD-10-CM | POA: Diagnosis not present

## 2019-04-24 DIAGNOSIS — Z03818 Encounter for observation for suspected exposure to other biological agents ruled out: Secondary | ICD-10-CM | POA: Diagnosis not present

## 2019-08-27 ENCOUNTER — Ambulatory Visit (INDEPENDENT_AMBULATORY_CARE_PROVIDER_SITE_OTHER): Payer: BC Managed Care – PPO | Admitting: Family Medicine

## 2019-08-27 ENCOUNTER — Other Ambulatory Visit: Payer: Self-pay

## 2019-08-27 ENCOUNTER — Encounter: Payer: Self-pay | Admitting: Family Medicine

## 2019-08-27 DIAGNOSIS — Z0000 Encounter for general adult medical examination without abnormal findings: Secondary | ICD-10-CM | POA: Insufficient documentation

## 2019-08-27 DIAGNOSIS — Z23 Encounter for immunization: Secondary | ICD-10-CM | POA: Diagnosis not present

## 2019-08-27 NOTE — Progress Notes (Signed)
  Patient Name: Ariel Turner Date of Birth: [birth year] Date of Visit: 08/27/19 PCP: Leeanne Rio, MD  Chief Complaint: physical   Subjective: Ariel Turner is a 28 y.o. with medical history significant for obesity, and history of low-grade squamous intraepithelial lesion who is presenting today for a physical.   Ariel Turner states that she has no complaints nor concerns and is just here to get a physical for her job.  Patient denies any needed paperwork or specific exams for physical.  On review of systems patient denies headaches, vision changes, chest pain, shortness of breath, abdominal pain, nausea, vomiting, dysuria, constipation, diarrhea, vaginal bleeding, vaginal discharge, vaginal itching, and joint pain.  Patient became tearful during exam and later disclose that she was uncomfortable video precepting.  I reemphasized that the video would only be used for educational purposes, patient still feel uncomfortable after video precepting was completed.  I informed patient that I would make note so that she would not have video precepting in the future.   I have reviewed the patient's medical, surgical, family, and social history as appropriate.  Vitals:   08/27/19 0905  BP: 120/70  Pulse: 64  Temp: 98.3 F (36.8 C)  SpO2: 99%    Physical Exam:   General: Alert and cooperative and appears to be in no acute distress HEENT: Neck non-tender without lymphadenopathy, masses or thyromegaly, no cervical lymphadenopathy, no oropharyngeal erythema or petechiae, no exudate Cardio: Normal S1 and S2, no S3 or S4. Rhythm is regular. No murmurs or rubs.   Pulm: Clear to auscultation bilaterally, no crackles, wheezing, or diminished breath sounds. Normal respiratory effort Abdomen: Bowel sounds normal. Abdomen soft and non-tender.  Extremities: No peripheral edema. Warm/ well perfused.  Radial pulses palpated bilaterally. Neuro: Patient is alert and oriented  with no signs of abnormal gait or neurological deficits Psych: patient is tearful during exam but denies depressed mood or anxiety.  Assessment & Plan:   Physical exam, annual Patient has no paperwork for this physical. Patient reports feeling well without concerns.  Discussed healthy lifestyle choices including diet and exercise.  Patient agrees to this plan.   Return to care as needed.   Stark Klein, MD  Family Medicine  PGY1

## 2019-08-27 NOTE — Assessment & Plan Note (Addendum)
Patient has no paperwork for this physical. Patient reports feeling well without concerns.  Discussed healthy lifestyle choices including diet and exercise.  Patient agrees to this plan.

## 2019-08-27 NOTE — Patient Instructions (Addendum)
It was a pleasure to see you today! Thank you for choosing [facility] for your primary care. Ariel Turner was seen for annual physical exam.   Our plans for today were:  Influenza Vaccine  Please continue to add more fruits and vegetables to your diet and try to find creative ways to incorporate physical activity back into your daily routine in order to best prevent development of conditions such as high blood pressure and diabetes later.   To keep you healthy, we need to monitor some screening tests.   You will be due for :  1. Pap Smear in 2021   You should return to our clinic as needed.   Best,  Dr. Rosita Fire  Healthy Eating Following a healthy eating pattern may help you to achieve and maintain a healthy body weight, reduce the risk of chronic disease, and live a long and productive life. It is important to follow a healthy eating pattern at an appropriate calorie level for your body. Your nutritional needs should be met primarily through food by choosing a variety of nutrient-rich foods. What are tips for following this plan? Reading food labels  Read labels and choose the following: ? Reduced or low sodium. ? Juices with 100% fruit juice. ? Foods with low saturated fats and high polyunsaturated and monounsaturated fats. ? Foods with whole grains, such as whole wheat, cracked wheat, brown rice, and wild rice. ? Whole grains that are fortified with folic acid. This is recommended for women who are pregnant or who want to become pregnant.  Read labels and avoid the following: ? Foods with a lot of added sugars. These include foods that contain brown sugar, corn sweetener, corn syrup, dextrose, fructose, glucose, high-fructose corn syrup, honey, invert sugar, lactose, malt syrup, maltose, molasses, raw sugar, sucrose, trehalose, or turbinado sugar.  Do not eat more than the following amounts of added sugar per day:  6 teaspoons (25 g) for women.  9 teaspoons  (38 g) for men. ? Foods that contain processed or refined starches and grains. ? Refined grain products, such as white flour, degermed cornmeal, white bread, and white rice. Shopping  Choose nutrient-rich snacks, such as vegetables, whole fruits, and nuts. Avoid high-calorie and high-sugar snacks, such as potato chips, fruit snacks, and candy.  Use oil-based dressings and spreads on foods instead of solid fats such as butter, stick margarine, or cream cheese.  Limit pre-made sauces, mixes, and "instant" products such as flavored rice, instant noodles, and ready-made pasta.  Try more plant-protein sources, such as tofu, tempeh, black beans, edamame, lentils, nuts, and seeds.  Explore eating plans such as the Mediterranean diet or vegetarian diet. Cooking  Use oil to saut or stir-fry foods instead of solid fats such as butter, stick margarine, or lard.  Try baking, boiling, grilling, or broiling instead of frying.  Remove the fatty part of meats before cooking.  Steam vegetables in water or broth. Meal planning   At meals, imagine dividing your plate into fourths: ? One-half of your plate is fruits and vegetables. ? One-fourth of your plate is whole grains. ? One-fourth of your plate is protein, especially lean meats, poultry, eggs, tofu, beans, or nuts.  Include low-fat dairy as part of your daily diet. Lifestyle  Choose healthy options in all settings, including home, work, school, restaurants, or stores.  Prepare your food safely: ? Wash your hands after handling raw meats. ? Keep food preparation surfaces clean by regularly washing with hot, soapy  water. ? Keep raw meats separate from ready-to-eat foods, such as fruits and vegetables. ? Cook seafood, meat, poultry, and eggs to the recommended internal temperature. ? Store foods at safe temperatures. In general:  Keep cold foods at 74F (4.4C) or below.  Keep hot foods at 174F (60C) or above.  Keep your freezer at  [facility] (-17.8C) or below.  Foods are no longer safe to eat when they have been between the temperatures of 40-174F (4.4-60C) for more than 2 hours. What foods should I eat? Fruits Aim to eat 2 cup-equivalents of fresh, canned (in natural juice), or frozen fruits each day. Examples of 1 cup-equivalent of fruit include 1 small apple, 8 large strawberries, 1 cup canned fruit,  cup dried fruit, or 1 cup 100% juice. Vegetables Aim to eat 2-3 cup-equivalents of fresh and frozen vegetables each day, including different varieties and colors. Examples of 1 cup-equivalent of vegetables include 2 medium carrots, 2 cups raw, leafy greens, 1 cup chopped vegetable (raw or cooked), or 1 medium baked potato. Grains Aim to eat 6 ounce-equivalents of whole grains each day. Examples of 1 ounce-equivalent of grains include 1 slice of bread, 1 cup ready-to-eat cereal, 3 cups popcorn, or  cup cooked rice, pasta, or cereal. Meats and other proteins Aim to eat 5-6 ounce-equivalents of protein each day. Examples of 1 ounce-equivalent of protein include 1 egg, 1/2 cup nuts or seeds, or 1 tablespoon (16 g) peanut butter. A cut of meat or fish that is the size of a deck of cards is about 3-4 ounce-equivalents.  Of the protein you eat each week, try to have at least 8 ounces come from seafood. This includes salmon, trout, herring, and anchovies. Dairy Aim to eat 3 cup-equivalents of fat-free or low-fat dairy each day. Examples of 1 cup-equivalent of dairy include 1 cup (240 mL) milk, 8 ounces (250 g) yogurt, 1 ounces (44 g) natural cheese, or 1 cup (240 mL) fortified soy milk. Fats and oils  Aim for about 5 teaspoons (21 g) per day. Choose monounsaturated fats, such as canola and olive oils, avocados, peanut butter, and most nuts, or polyunsaturated fats, such as sunflower, corn, and soybean oils, walnuts, pine nuts, sesame seeds, sunflower seeds, and flaxseed. Beverages  Aim for six 8-oz glasses of water per day.  Limit coffee to three to five 8-oz cups per day.  Limit caffeinated beverages that have added calories, such as soda and energy drinks.  Limit alcohol intake to no more than 1 drink a day for nonpregnant women and 2 drinks a day for men. One drink equals 12 oz of beer (355 mL), 5 oz of wine (148 mL), or 1 oz of hard liquor (44 mL). Seasoning and other foods  Avoid adding excess amounts of salt to your foods. Try flavoring foods with herbs and spices instead of salt.  Avoid adding sugar to foods.  Try using oil-based dressings, sauces, and spreads instead of solid fats. This information is based on general [place] nutrition guidelines. For more information, visit BuildDNA.es. Exact amounts may vary based on your nutrition needs. Summary  A healthy eating plan may help you to maintain a healthy weight, reduce the risk of chronic diseases, and stay active throughout your life.  Plan your meals. Make sure you eat the right portions of a variety of nutrient-rich foods.  Try baking, boiling, grilling, or broiling instead of frying.  Choose healthy options in all settings, including home, work, school, restaurants, or stores. This information is  not intended to replace advice given to you by your health care provider. Make sure you discuss any questions you have with your health care provider. Document Released: 03/06/2018 Document Revised: 03/06/2018 Document Reviewed: 03/06/2018 Elsevier Patient Education  2020 [employer].

## 2019-09-04 ENCOUNTER — Encounter: Payer: Self-pay | Admitting: Family Medicine

## 2019-09-06 ENCOUNTER — Telehealth (INDEPENDENT_AMBULATORY_CARE_PROVIDER_SITE_OTHER): Payer: BC Managed Care – PPO | Admitting: Family Medicine

## 2019-09-06 DIAGNOSIS — L209 Atopic dermatitis, unspecified: Secondary | ICD-10-CM

## 2019-09-06 MED ORDER — TRIAMCINOLONE ACETONIDE 0.1 % EX OINT: 1.0000 "application " | TOPICAL_OINTMENT | Freq: Two times a day (BID) | CUTANEOUS | 2 refills | Status: DC

## 2019-09-06 NOTE — Addendum Note (Signed)
Addended by: Daisy Floro on: 09/06/2019 04:59 PM   Modules accepted: Orders

## 2019-09-06 NOTE — Assessment & Plan Note (Addendum)
Patient's itchy, red rash is most likely atopic dermatitis/eczema.  Rash has appearance of erythematous, puritic papules that are firm.  She denies the appearance of silvery, scaly patches (psoriasis).  Patient has not taken any new medications which makes drug allergy less likely cause of the rash.  Patient also denies history of celiac's disease, and rash does not have a blistering appearance, however she does have a history of anemia, so dermatitis herpetiformis is still a possible culprit.  Patient also denies any new soaps or lotions which makes a contact dermatitis less likely. -Prescribed triamcinolone 0.1% topical to be applied twice daily to affected area -Patient encouraged to continue treatment with topical steroid for 1 week after bumps disappear to assure thorough treatment -Patient informed bumps/rash can reappear in other areas and to follow suit with treatment described above -Patient to follow up 09/17/2019 with PCP, ordered CBC at that time

## 2019-09-06 NOTE — Progress Notes (Addendum)
[facility] Visit  Patient consented to have virtual visit. Method of visit: Video  Encounter participants: Patient: Ariel Turner - located at Home Provider: Daisy Floro - located at Work from Home Others (if applicable): None  Chief Complaint: Rash on elbows, knee  HPI:  Patient is a pleasant 28 year old female reporting 2 weeks of progressively worsening itchy, red rash.  It started on her knee and right elbow, has spread to both elbows and is moving to the knuckles of her right hand.  She describes the rash as itchy, but states if she scratches the rash it becomes irritated and hurts.  She has never had this before.  She denies any new soaps or lotions.  She has no history of celiac's disease.  She denies taking any new medications.  She also denies fevers, headaches, shortness of breath, nausea, vomiting, abdominal pain, diarrhea, and constipation.  Has a history of anemia with Hgb at 8.8 even 2 months after her son was born in June 2018. The CBC was never followed up. Patient is 5'2" she is shorter than her mother, dad is a little taller than her. No siblings.   ROS: per HPI  Pertinent PMHx: history of anemia, no history of food allergies  Exam:  Respiratory: normal work of breathing, speaking in full sentences  PHOTOS OF RASH CAN BE SEEN IN 09/04/2019 CORRESPONDENCE TITLED "PATIENT MESSAGE".  Assessment/Plan: Atopic dermatitis Patient's itchy, red rash is most likely atopic dermatitis/eczema.  Rash has appearance of erythematous, puritic papules that are firm.  She denies the appearance of silvery, scaly patches (psoriasis).  Patient has not taken any new medications which makes drug allergy less likely cause of the rash.  Patient also denies history of celiac's disease, and rash does not have a blistering appearance, however she does have a history of anemia, so dermatitis herpetiformis is still a possible culprit.  Patient also  denies any new soaps or lotions which makes a contact dermatitis less likely. -Prescribed triamcinolone 0.1% topical to be applied twice daily to affected area -Patient encouraged to continue treatment with topical steroid for 1 week after bumps disappear to assure thorough treatment -Patient informed bumps/rash can reappear in other areas and to follow suit with treatment described above -Patient to follow up 09/17/2019 with PCP, ordered CBC at that time    Time spent during visit with patient: 14:00 minutes  Milus Banister, [place], PGY-2 09/06/2019 4:59 PM

## 2019-09-17 ENCOUNTER — Ambulatory Visit: Payer: BC Managed Care – PPO | Admitting: Family Medicine

## 2019-09-27 ENCOUNTER — Other Ambulatory Visit: Payer: Self-pay

## 2019-09-27 DIAGNOSIS — J069 Acute upper respiratory infection, unspecified: Secondary | ICD-10-CM | POA: Diagnosis not present

## 2019-09-27 DIAGNOSIS — Z20822 Contact with and (suspected) exposure to covid-19: Secondary | ICD-10-CM

## 2019-09-29 LAB — NOVEL CORONAVIRUS, NAA: SARS-CoV-2, NAA: NOT DETECTED

## 2019-10-16 ENCOUNTER — Other Ambulatory Visit: Payer: Self-pay

## 2019-10-16 DIAGNOSIS — Z20822 Contact with and (suspected) exposure to covid-19: Secondary | ICD-10-CM

## 2019-10-17 LAB — NOVEL CORONAVIRUS, NAA: SARS-CoV-2, NAA: NOT DETECTED

## 2019-12-16 DIAGNOSIS — J069 Acute upper respiratory infection, unspecified: Secondary | ICD-10-CM | POA: Diagnosis not present

## 2019-12-18 ENCOUNTER — Ambulatory Visit: Payer: BC Managed Care – PPO | Attending: Internal Medicine

## 2019-12-18 DIAGNOSIS — Z20822 Contact with and (suspected) exposure to covid-19: Secondary | ICD-10-CM | POA: Diagnosis not present

## 2019-12-19 LAB — NOVEL CORONAVIRUS, NAA: SARS-CoV-2, NAA: DETECTED — AB

## 2019-12-27 ENCOUNTER — Ambulatory Visit: Payer: BC Managed Care – PPO | Attending: Internal Medicine

## 2019-12-27 ENCOUNTER — Other Ambulatory Visit: Payer: Self-pay

## 2019-12-27 DIAGNOSIS — Z20822 Contact with and (suspected) exposure to covid-19: Secondary | ICD-10-CM

## 2019-12-28 LAB — NOVEL CORONAVIRUS, NAA: SARS-CoV-2, NAA: NOT DETECTED

## 2020-01-15 DIAGNOSIS — B342 Coronavirus infection, unspecified: Secondary | ICD-10-CM | POA: Diagnosis not present

## 2020-03-20 DIAGNOSIS — J069 Acute upper respiratory infection, unspecified: Secondary | ICD-10-CM | POA: Diagnosis not present

## 2020-03-20 DIAGNOSIS — Z20822 Contact with and (suspected) exposure to covid-19: Secondary | ICD-10-CM | POA: Diagnosis not present

## 2020-03-21 DIAGNOSIS — R05 Cough: Secondary | ICD-10-CM | POA: Diagnosis not present

## 2020-03-21 DIAGNOSIS — Z20822 Contact with and (suspected) exposure to covid-19: Secondary | ICD-10-CM | POA: Diagnosis not present

## 2020-03-21 DIAGNOSIS — R509 Fever, unspecified: Secondary | ICD-10-CM | POA: Diagnosis not present

## 2020-03-21 DIAGNOSIS — J029 Acute pharyngitis, unspecified: Secondary | ICD-10-CM | POA: Diagnosis not present

## 2020-09-02 ENCOUNTER — Encounter: Payer: BC Managed Care – PPO | Admitting: Family Medicine

## 2020-09-23 ENCOUNTER — Encounter: Payer: Self-pay | Admitting: Family Medicine

## 2020-11-14 ENCOUNTER — Encounter: Payer: Self-pay | Admitting: Family Medicine

## 2020-12-16 ENCOUNTER — Encounter: Payer: Self-pay | Admitting: Family Medicine

## 2020-12-18 ENCOUNTER — Ambulatory Visit: Payer: Self-pay | Admitting: Family Medicine

## 2021-07-28 ENCOUNTER — Encounter: Payer: Self-pay | Admitting: Family Medicine

## 2022-05-11 ENCOUNTER — Encounter: Payer: Self-pay | Admitting: *Deleted

## 2022-12-27 ENCOUNTER — Other Ambulatory Visit (HOSPITAL_COMMUNITY)
Admission: RE | Admit: 2022-12-27 | Discharge: 2022-12-27 | Disposition: A | Discharge status: Home / Self Care | Payer: BC Managed Care – PPO | Source: Ambulatory Visit | Attending: Family Medicine | Admitting: Family Medicine

## 2022-12-27 ENCOUNTER — Ambulatory Visit (INDEPENDENT_AMBULATORY_CARE_PROVIDER_SITE_OTHER): Payer: Self-pay | Admitting: Family Medicine

## 2022-12-27 ENCOUNTER — Encounter: Payer: Self-pay | Admitting: Family Medicine

## 2022-12-27 ENCOUNTER — Other Ambulatory Visit: Payer: Self-pay

## 2022-12-27 VITALS — BP 135/83 | HR 90 | Wt 185.4 lb

## 2022-12-27 DIAGNOSIS — Z113 Encounter for screening for infections with a predominantly sexual mode of transmission: Secondary | ICD-10-CM

## 2022-12-27 DIAGNOSIS — Z124 Encounter for screening for malignant neoplasm of cervix: Secondary | ICD-10-CM | POA: Insufficient documentation

## 2022-12-27 DIAGNOSIS — Z23 Encounter for immunization: Secondary | ICD-10-CM

## 2022-12-27 DIAGNOSIS — D649 Anemia, unspecified: Secondary | ICD-10-CM

## 2022-12-27 DIAGNOSIS — F439 Reaction to severe stress, unspecified: Secondary | ICD-10-CM

## 2022-12-27 DIAGNOSIS — Z13220 Encounter for screening for lipoid disorders: Secondary | ICD-10-CM

## 2022-12-27 DIAGNOSIS — N889 Noninflammatory disorder of cervix uteri, unspecified: Secondary | ICD-10-CM

## 2022-12-27 DIAGNOSIS — R634 Abnormal weight loss: Secondary | ICD-10-CM

## 2022-12-27 DIAGNOSIS — Z0000 Encounter for general adult medical examination without abnormal findings: Secondary | ICD-10-CM

## 2022-12-27 NOTE — Progress Notes (Signed)
  Date of Visit: 12/27/2022   SUBJECTIVE:   HPI:  Sheylin presents today for a well woman exam.   Concerns today: unintentional weight loss, see below Periods: Little heavier and more painful than they used to be but gets them regularly Contraception: Husband had vasectomy Pelvic symptoms: Denies abnormal discharge or pain Sexual activity: With husband STD Screening: Agreeable to screening today Pap smear status: Collecting today Smoking: Not currently Alcohol: No Drugs: No Mood: Generally good, no concerns.  Does have increased stress as the father of her oldest child is about to get out of prison.  Unintentional weight loss: Has not been seen here in over 3 years, but reports last summer she lost approximately 40 pounds relatively quickly without trying to.  She was eating a normal diet at that time and had normal appetite.  Denies any associated symptoms including fevers, night sweats, chronic cough, blood in stool.  She has noticed that her periods are more heavy and painful than they used to be.  Otherwise she feels fine.  She is nervous about what this could mean and knows it needs evaluation.  Has also been losing hair and noticing her hair being more thin.   OBJECTIVE:   BP 135/83   Pulse 90   Wt 185 lb 6.4 oz (84.1 kg)   LMP 12/20/2022   SpO2 100%   BMI 33.91 kg/m  Gen: NAD, pleasant, cooperative HEENT: NCAT, PERRL, no palpable thyromegaly or anterior cervical lymphadenopathy Heart: RRR, no murmurs Lungs: CTAB, normal work of breathing Breasts: bilateral breasts normal in appearance. No erythema, deformity, or nipple discharge. No palpable abnormal masses. No axillary lymphadenopathy.  Abdomen: soft, nontender to palpation Neuro: grossly nonfocal, speech normal GU: normal appearing external genitalia without lesions. Vagina is moist with white discharge. Cervix has some ectropion at cervical os, though slightly irregular in distribution. No cervical motion tenderness  or tenderness on bimanual exam. No adnexal masses. Chaperone present - Deseree Blount CMA  ASSESSMENT/PLAN:   Health maintenance:  -STD screening:  obtained with pap today -pap smear: collected today with HPV cotest -lipid screening: collect today -immunizations:  Flu: given today Tdap: UTD COVID: booster today -handout given on health maintenance topics  Unintentional weight loss Etiology not fully clear by history.  Weight seems to have stabilized since summer 2023.  Is notably 40 pounds down from her last documented weight in our system.  Discussed options for evaluation with patient.  Plan is as follows: -Labs today: CBC, c-Met, TSH, QuantiFERON gold, HIV antibody -Refer to GI for consideration of colonoscopy -Schedule appointment in [facility] for slightly atypical appearance of cervix (though may just be ectropion) -Follow-up with me in office after GI appointment  Abnormal appearance of cervix Appearance of cervix may represent ectropion, however given history of prior abnormal Paps and unintentional weight loss, want her evaluated in colpo clinic to look at cervix closer.  Patient is agreeable for this and will get it scheduled.  Nicholas. Ardelia Mems, [place]

## 2022-12-27 NOTE — Patient Instructions (Addendum)
It was great to see you again today.  Checking labs today Kidneys, liver, thyroid, blood counts, cholesterol, STI testing, tuberculosis, pap smear  Schedule colposcopy clinic appointment to have your cervix looked at in closer detail  Referring to GI doctor to talk about colonoscopy  For information on therapists, please go to [email or web address]. You can also contact your insurance company to find an in-network therapist.   Be well, Dr. Ardelia Mems  Health Maintenance, Female Adopting a healthy lifestyle and getting preventive care are important in promoting health and wellness. Ask your health care provider about: The right schedule for you to have regular tests and exams. Things you can do on your own to prevent diseases and keep yourself healthy. What should I know about diet, weight, and exercise? Eat a healthy diet  Eat a diet that includes plenty of vegetables, fruits, low-fat dairy products, and lean protein. Do not eat a lot of foods that are high in solid fats, added sugars, or sodium. Maintain a healthy weight Body mass index (BMI) is used to identify weight problems. It estimates body fat based on height and weight. Your health care provider can help determine your BMI and help you achieve or maintain a healthy weight. Get regular exercise Get regular exercise. This is one of the most important things you can do for your health. Most adults should: Exercise for at least 150 minutes each week. The exercise should increase your heart rate and make you sweat (moderate-intensity exercise). Do strengthening exercises at least twice a week. This is in addition to the moderate-intensity exercise. Spend less time sitting. Even light physical activity can be beneficial. Watch cholesterol and blood lipids Have your blood tested for lipids and cholesterol at 31 years of age, then have this test every 5 years. Have your cholesterol levels checked more often if: Your lipid or  cholesterol levels are high. You are older than 32 years of age. You are at high risk for heart disease. What should I know about cancer screening? Depending on your health history and family history, you may need to have cancer screening at various ages. This may include screening for: Breast cancer. Cervical cancer. Colorectal cancer. Skin cancer. Lung cancer. What should I know about heart disease, diabetes, and high blood pressure? Blood pressure and heart disease High blood pressure causes heart disease and increases the risk of stroke. This is more likely to develop in people who have high blood pressure readings or are overweight. Have your blood pressure checked: Every 3-5 years if you are 44-32 years of age. Every year if you are 68 years old or older. Diabetes Have regular diabetes screenings. This checks your fasting blood sugar level. Have the screening done: Once every three years after age 73 if you are at a normal weight and have a low risk for diabetes. More often and at a younger age if you are overweight or have a high risk for diabetes. What should I know about preventing infection? Hepatitis B If you have a higher risk for hepatitis B, you should be screened for this virus. Talk with your health care provider to find out if you are at risk for hepatitis B infection. Hepatitis C Testing is recommended for: Everyone born from [birth year] through 1964. Anyone with known risk factors for hepatitis C. Sexually transmitted infections (STIs) Get screened for STIs, including gonorrhea and chlamydia, if: You are sexually active and are younger than 31 years of age. You are older than 31 years of age  and your health care provider tells you that you are at risk for this type of infection. Your sexual activity has changed since you were last screened, and you are at increased risk for chlamydia or gonorrhea. Ask your health care provider if you are at risk. Ask your health care  provider about whether you are at high risk for HIV. Your health care provider may recommend a prescription medicine to help prevent HIV infection. If you choose to take medicine to prevent HIV, you should first get tested for HIV. You should then be tested every 3 months for as long as you are taking the medicine. Pregnancy If you are about to stop having your period (premenopausal) and you may become pregnant, seek counseling before you get pregnant. Take 400 to 800 micrograms (mcg) of folic acid every day if you become pregnant. Ask for birth control (contraception) if you want to prevent pregnancy. Osteoporosis and menopause Osteoporosis is a disease in which the bones lose minerals and strength with aging. This can result in bone fractures. If you are 46 years old or older, or if you are at risk for osteoporosis and fractures, ask your health care provider if you should: Be screened for bone loss. Take a calcium or vitamin D supplement to lower your risk of fractures. Be given hormone replacement therapy (HRT) to treat symptoms of menopause. Follow these instructions at home: Alcohol use Do not drink alcohol if: Your health care provider tells you not to drink. You are pregnant, may be pregnant, or are planning to become pregnant. If you drink alcohol: Limit how much you have to: 0-1 drink a day. Know how much alcohol is in your drink. In the [place] one drink equals one 12 oz bottle of beer (355 mL), one 5 oz glass of wine (148 mL), or one 1 oz glass of hard liquor (44 mL). Lifestyle Do not use any products that contain nicotine or tobacco. These products include cigarettes, chewing tobacco, and vaping devices, such as e-cigarettes. If you need help quitting, ask your health care provider. Do not use street drugs. Do not share needles. Ask your health care provider for help if you need support or information about quitting drugs. General instructions Schedule regular health, dental, and  eye exams. Stay current with your vaccines. Tell your health care provider if: You often feel depressed. You have ever been abused or do not feel safe at home. Summary Adopting a healthy lifestyle and getting preventive care are important in promoting health and wellness. Follow your health care provider's instructions about healthy diet, exercising, and getting tested or screened for diseases. Follow your health care provider's instructions on monitoring your cholesterol and blood pressure. This information is not intended to replace advice given to you by your health care provider. Make sure you discuss any questions you have with your health care provider. Document Revised: 04/13/2021 Document Reviewed: 04/13/2021 Elsevier Patient Education  [place].

## 2022-12-28 ENCOUNTER — Encounter: Payer: Self-pay | Admitting: Family Medicine

## 2022-12-28 DIAGNOSIS — D649 Anemia, unspecified: Secondary | ICD-10-CM

## 2022-12-28 DIAGNOSIS — N889 Noninflammatory disorder of cervix uteri, unspecified: Secondary | ICD-10-CM | POA: Insufficient documentation

## 2022-12-28 DIAGNOSIS — F439 Reaction to severe stress, unspecified: Secondary | ICD-10-CM | POA: Insufficient documentation

## 2022-12-28 DIAGNOSIS — R634 Abnormal weight loss: Secondary | ICD-10-CM | POA: Insufficient documentation

## 2022-12-28 MED ORDER — FERROUS SULFATE 325 (65 FE) MG PO TABS: 325.0000 mg | ORAL_TABLET | Freq: Every day | ORAL | 1 refills | Status: AC

## 2022-12-28 NOTE — Assessment & Plan Note (Signed)
Overall coping well, does have some stressful events coming up.  Provided resources with website to locate therapist that takes her insurance.

## 2022-12-28 NOTE — Assessment & Plan Note (Signed)
Appearance of cervix may represent ectropion, however given history of prior abnormal Paps and unintentional weight loss, want her evaluated in colpo clinic to look at cervix closer.  Patient is agreeable for this and will get it scheduled.

## 2022-12-28 NOTE — Telephone Encounter (Signed)
Spoke with patient.  Advised of message below.  Discussed options for treating anemia.  Patient reports feeling tired especially around her period, otherwise is not very symptomatic.  Patient elected to start with oral iron therapy.  I sent this in for her and advised her to take it once a day.  Awaiting results of ferritin, iron sat, and TIBC.  Plan to recheck CBC on February 8 when she returns for colpo appointment.  Remains appropriate to continue with referral to GI, especially in light of prior unintentional weight loss.  Patient appreciative.  Leeanne Rio, MD

## 2022-12-28 NOTE — Assessment & Plan Note (Signed)
Etiology not fully clear by history.  Weight seems to have stabilized since summer 2023.  Is notably 40 pounds down from her last documented weight in our system.  Discussed options for evaluation with patient.  Plan is as follows: -Labs today: CBC, c-Met, TSH, QuantiFERON gold, HIV antibody -Refer to GI for consideration of colonoscopy -Schedule appointment in [facility] clinic for slightly atypical appearance of cervix (though may just be ectropion) -Follow-up with me in office after GI appointment

## 2022-12-29 LAB — CBC
Hematocrit: 26.3 % — ABNORMAL LOW (ref 34.0–46.6)
Hemoglobin: 6.7 g/dL — CL (ref 11.1–15.9)
MCH: 14.9 pg — ABNORMAL LOW (ref 26.6–33.0)
MCHC: 25.5 g/dL — ABNORMAL LOW (ref 31.5–35.7)
MCV: 58 fL — ABNORMAL LOW (ref 79–97)
Platelets: 316 10*3/uL (ref 150–450)
RBC: 4.51 x10E6/uL (ref 3.77–5.28)
RDW: 20.5 % — ABNORMAL HIGH (ref 11.7–15.4)
WBC: 4.1 10*3/uL (ref 3.4–10.8)

## 2022-12-29 LAB — CMP14+EGFR
ALT: 11 IU/L (ref 0–32)
AST: 16 IU/L (ref 0–40)
Albumin/Globulin Ratio: 1.4 (ref 1.2–2.2)
Albumin: 4 g/dL (ref 4.0–5.0)
Alkaline Phosphatase: 101 IU/L (ref 44–121)
BUN/Creatinine Ratio: 13 (ref 9–23)
BUN: 9 mg/dL (ref 6–20)
Bilirubin Total: 0.6 mg/dL (ref 0.0–1.2)
CO2: 20 mmol/L (ref 20–29)
Calcium: 8.8 mg/dL (ref 8.7–10.2)
Chloride: 104 mmol/L (ref 96–106)
Creatinine, Ser: 0.7 mg/dL (ref 0.57–1.00)
Globulin, Total: 2.8 g/dL (ref 1.5–4.5)
Glucose: 82 mg/dL (ref 70–99)
Potassium: 3.9 mmol/L (ref 3.5–5.2)
Sodium: 140 mmol/L (ref 134–144)
Total Protein: 6.8 g/dL (ref 6.0–8.5)
eGFR: 119 mL/min/{1.73_m2} (ref >59)

## 2022-12-29 LAB — TSH RFX ON ABNORMAL TO FREE T4: TSH: 2.25 u[IU]/mL (ref 0.450–4.500)

## 2022-12-29 LAB — LIPID PANEL
Chol/HDL Ratio: 4 ratio (ref 0.0–4.4)
Cholesterol, Total: 148 mg/dL (ref 100–199)
HDL: 37 mg/dL — ABNORMAL LOW (ref >39)
LDL Chol Calc (NIH): 95 mg/dL (ref 0–99)
Triglycerides: 80 mg/dL (ref 0–149)
VLDL Cholesterol Cal: 16 mg/dL (ref 5–40)

## 2022-12-29 LAB — HIV ANTIBODY (ROUTINE TESTING W REFLEX): HIV Screen 4th Generation wRfx: NONREACTIVE

## 2022-12-29 LAB — QUANTIFERON-TB GOLD PLUS
QuantiFERON Mitogen Value: >10 IU/mL
QuantiFERON Nil Value: 0 IU/mL
QuantiFERON TB1 Ag Value: 0 IU/mL
QuantiFERON TB2 Ag Value: 0 IU/mL
QuantiFERON-TB Gold Plus: NEGATIVE

## 2022-12-29 LAB — IRON AND TIBC
Iron Saturation: 3 % — CL (ref 15–55)
Iron: 11 ug/dL — ABNORMAL LOW (ref 27–159)
Total Iron Binding Capacity: 395 ug/dL (ref 250–450)
UIBC: 384 ug/dL (ref 131–425)

## 2022-12-29 LAB — RPR: RPR Ser Ql: NONREACTIVE

## 2022-12-29 LAB — FERRITIN: Ferritin: 2 ng/mL — ABNORMAL LOW (ref 15–150)

## 2022-12-29 LAB — SPECIMEN STATUS REPORT

## 2022-12-31 LAB — CYTOLOGY - PAP
Chlamydia: NEGATIVE
Comment: NEGATIVE
Comment: NEGATIVE
Comment: NEGATIVE
Comment: NORMAL
Diagnosis: NEGATIVE
High risk HPV: NEGATIVE
Neisseria Gonorrhea: NEGATIVE
Trichomonas: NEGATIVE

## 2023-01-03 ENCOUNTER — Other Ambulatory Visit: Payer: Self-pay | Admitting: Family Medicine

## 2023-01-03 DIAGNOSIS — D649 Anemia, unspecified: Secondary | ICD-10-CM

## 2023-01-05 ENCOUNTER — Encounter: Payer: Self-pay | Admitting: Gastroenterology

## 2023-01-05 ENCOUNTER — Ambulatory Visit: Payer: BC Managed Care – PPO | Admitting: Gastroenterology

## 2023-01-05 ENCOUNTER — Other Ambulatory Visit: Payer: BC Managed Care – PPO

## 2023-01-05 VITALS — BP 126/80 | HR 71 | Ht 62.0 in | Wt 183.5 lb

## 2023-01-05 DIAGNOSIS — D509 Iron deficiency anemia, unspecified: Secondary | ICD-10-CM

## 2023-01-05 DIAGNOSIS — R634 Abnormal weight loss: Secondary | ICD-10-CM | POA: Diagnosis not present

## 2023-01-05 MED ORDER — NA SULFATE-K SULFATE-MG SULF 17.5-3.13-1.6 GM/177ML PO SOLN: 1.0000 | Freq: Once | ORAL | 0 refills | Status: AC

## 2023-01-05 NOTE — Progress Notes (Unsigned)
HPI : Ariel Turner is a very pleasant 32 year old female with no significant past medical history is referred to [place] by Dr. Chrisandra Netters for further evaluation of iron deficiency anemia and unintentional weight loss.  The patient says that she started noticing her clothes were fitting loose in early 2023.  Pretty soon, she was having to buy new clothes because she had dropped 6 sizes.  She states that she typically weighed between 215 and 230lbs.  She weighed 190lbs in August 2023 and now weighs 183lbs.  She denies any change in her diet, appetite or eating habits.  She did not increase or change her physical activity/exercise habits.  She saw her PCP last week and blood tests revealed severe iron deficiency anemia, with a hgb of 6.7, MCV 58, ferritin 2, serum iron 11, saturation 3%.  She had not been to a doctor in several years since her last pregnancy in 2018.  She was anemic then as well, with a hgb 8-9.  She recalls being told she was iron deficient during pregnancies, but not otherwise. She is scheduled for an outpatient blood transfusion tomorrow.  She denies any GI symptoms such as abdominal pain, constipation, diarrhea, hematochezia or melena. No symptoms of nausea/vomiting, early satiety, dyspepsia, dysphagia, heartburn/regurgitation.  She has a bowel movement usually every day or every other day.  She was recent started on iron tablets and miraLax which makes her stools loose.  She has noticed a significant change in her menses in the past year.  In addition to having worse pelvic pain with menses, she has had a marked increase in menstrual blood flow.  Her typical periods consist of 4 days of very heavy flow to the point where she has to change a pad or tampon every hour for 4 days straight.  She has to wake up at night to change as well.  After those 4 days, she will have 3 days of light bleeding/spotting.  She reports feeling tired and getting short of breath with physical  activity, but otherwise denies symptoms such as light-headedness/dizziness, presyncope, chest pain, dyspnea at rest.  She denies any family history of GI or gynecologic malignancy.  No fam hx of IBD or celiac disease.  However, her father was found to have over 30 polyps on his colonoscopy 2 years ago.  Component Ref Range & Units 10 d ago (12/27/22) 5 yr ago (07/14/17) 5 yr ago (05/30/17) 5 yr ago (04/15/17) 5 yr ago (04/11/17) 6 yr ago (12/20/16) 6 yr ago (10/05/16)  WBC 3.4 - 10.8 x10E3/uL 4.1 7.6 R 9.4 R 8.2 9.2 R 9.9 R 5.3 R  RBC 3.77 - 5.28 x10E6/uL 4.51 4.62 R 4.17 R 4.17 4.08 R 4.73 R 4.85 R  Hemoglobin 11.1 - 15.9 g/dL 6.7 Low Panic  8.8 Low  R 8.3 Low  R 8.9 Low  8.9 Low  R 11.0 Low  R 10.6 Low  R  Hematocrit 34.0 - 46.6 % 26.3 Low  30.2 Low  R 27.5 Low  R 28.5 Low  28.5 Low  R 34.7 Low  R 33.3 Low  R  MCV 79 - 97 fL 58 Low  65.4 Low  R 65.9 Low  R 68 Low  69.9 Low  R 73.4 Low  R 68.7 Low  R  MCH 26.6 - 33.0 pg 14.9 Low  19.0 Low  R 19.9 Low  R 21.3 Low  21.8 Low  R 23.3 Low  R 21.9 Low  R  MCHC 31.5 - 35.7 g/dL 25.5 Low  29.1 Low  R 30.2 R 31.2 Low  31.2 R 31.7 Low  R 31.8 R  RDW 11.7 - 15.4 % 20.5 High  17.4 High  R 16.6 High  R 16.0 High  R 14.9 R 18.4 High  R 17.7 High  R  Platelets 150 - 450 x10E3/uL 316 321 R 244 R 238 R 260 R 288 R 310   Component Ref Range & Units 10 d ago  Total Iron Binding Capacity 250 - 450 ug/dL 395  UIBC 131 - 425 ug/dL 384  Iron 27 - 159 ug/dL 11 Low   Iron Saturation 15 - 55 % 3 Low Panic   Resulting Agency  [facility]   Component Ref Range & Units 10 d ago  Ferritin 15 - 150 ng/mL 2 Low    Past Medical History:  Diagnosis Date   Anemia    Heart murmur    during infancy   Pregnancy induced hypertension      Past Surgical History:  Procedure Laterality Date   NO PAST SURGERIES     TENDON REPAIR     Family History  Problem Relation Age of Onset   Thyroid disease Mother    Hypertension Father    Diabetes Maternal Grandmother     Hypertension Maternal Grandmother    Cancer Maternal Grandfather        lung   Throat cancer Maternal Grandfather    Colon cancer Neg Hx    Stomach cancer Neg Hx    Social History   Tobacco Use   Smoking status: Former    Types: Cigarettes    Quit date: 2013    Years since quitting: 11.0   Smokeless tobacco: Never   Tobacco comments:    quit prior to 1st preg  Vaping Use   Vaping Use: Never used  Substance Use Topics   Alcohol use: Not Currently   Drug use: No   Current Outpatient Medications  Medication Sig Dispense Refill   ferrous sulfate 325 (65 FE) MG tablet Take 1 tablet (325 mg total) by mouth daily. 90 tablet 1   No current facility-administered medications for this visit.   No Known Allergies   Review of Systems: All systems reviewed and negative except where noted in HPI.    No results found.  Physical Exam: BP 126/80   Pulse 71   Ht 5\' 2"  (1.575 m)   Wt 183 lb 8 oz (83.2 kg)   LMP 12/20/2022   BMI 33.56 kg/m  Constitutional: Pleasant,well-developed, Hispanic female in no acute distress. HEENT: Normocephalic and atraumatic. Conjunctivae are pale No scleral icterus. Neck supple.  Cardiovascular: Normal rate, regular rhythm.  Pulmonary/chest: Effort normal and breath sounds normal. No wheezing, rales or rhonchi. Abdominal: Soft, nondistended, nontender. Bowel sounds active throughout. There are no masses palpable. No hepatomegaly. Extremities: no edema Lymphadenopathy: No cervical adenopathy noted. Neurological: Alert and oriented to person place and time. Skin: Skin is warm and dry. No rashes noted. Psychiatric: Normal mood and affect. Behavior is normal.  CBC    Component Value Date/Time   WBC 4.1 12/27/2022 1136   WBC 7.6 07/14/2017 0610   RBC 4.51 12/27/2022 1136   RBC 4.62 07/14/2017 0610   HGB 6.7 (LL) 12/27/2022 1136   HCT 26.3 (L) 12/27/2022 1136   PLT 316 12/27/2022 1136   MCV 58 (L) 12/27/2022 1136   MCH 14.9 (L) 12/27/2022 1136    MCH 19.0 (L) 07/14/2017 3818  MCHC 25.5 (L) 12/27/2022 1136   MCHC 29.1 (L) 07/14/2017 0610   RDW 20.5 (H) 12/27/2022 1136   LYMPHSABS 1,584 12/20/2016 1121   MONOABS 495 12/20/2016 1121   EOSABS 198 12/20/2016 1121   BASOSABS 0 12/20/2016 1121    CMP     Component Value Date/Time   NA 140 12/27/2022 1136   K 3.9 12/27/2022 1136   CL 104 12/27/2022 1136   CO2 20 12/27/2022 1136   GLUCOSE 82 12/27/2022 1136   GLUCOSE 110 (H) 07/14/2017 0610   BUN 9 12/27/2022 1136   CREATININE 0.70 12/27/2022 1136   CREATININE 0.56 08/02/2013 1548   CALCIUM 8.8 12/27/2022 1136   PROT 6.8 12/27/2022 1136   ALBUMIN 4.0 12/27/2022 1136   AST 16 12/27/2022 1136   ALT 11 12/27/2022 1136   ALKPHOS 101 12/27/2022 1136   BILITOT 0.6 12/27/2022 1136   GFRNONAA >60 07/14/2017 0610   GFRAA >60 07/14/2017 0610     ASSESSMENT AND PLAN: 32 year old female with 35-40 pound unintentional weight loss over a year with profound iron deficiency anemia (hgb 6.7, ferritin 2) in the setting of recent change in menses (very heavy menses, increased pelvic pain).  She does not have any GI symptoms such as abdominal pain, diarrhea, early satiety/change in appetite or overt GI bleeding to support a GI cause of her anemia or weight loss. Her father does have a history of many (66) colon polyps (details unknown).  Although I suspect a uterine source to her anemia, an endoscopic evaluation is definitely warranted given the severity of her anemia and unintentional weight loss.  I would also recommend a CT of the abdomen/pelvis to evaluate for uterine/extraluminal malignancy. Will screen for celiac disease and H. Pylori now with TTG/IgA and H. Pylori stool antigen.  Iron deficiency anemia/unintentional weight loss - EGD, colonoscopy - CT abdomen/pelvis w/ contrast - TTG/IgA, H. Pylori stool ag - Patient to received blood transfusion tomorrow - Continue PO iron; suggested colace instead of MiraLax to reduce loose  stool  Family history of polyps - Colonoscopy - Depending on details of father's polyps (hyperplastic vs adenomas), patient may need colonoscopy 5 years; await results of patient's colonoscopy  The details, risks (including bleeding, perforation, infection, missed lesions, medication reactions and possible hospitalization or surgery if complications occur), benefits, and alternatives to colonoscopy with possible biopsy and possible polypectomy were discussed with the patient and she consents to proceed.   Tynisa Vohs E. Candis Schatz, MD [facility]  CC:  Leeanne Rio, MD

## 2023-01-05 NOTE — Patient Instructions (Signed)
If you are age 31 or older, your body mass index should be between 23-30. Your Body mass index is 33.56 kg/m. If this is out of the aforementioned range listed, please consider follow up with your Primary Care Provider.  If you are age 110 or younger, your body mass index should be between 19-25. Your Body mass index is 33.56 kg/m. If this is out of the aformentioned range listed, please consider follow up with your Primary Care Provider.   You have been scheduled for an endoscopy and colonoscopy. Please follow the written instructions given to you at your visit today. Please pick up your prep supplies at the pharmacy within the next 1-3 days. If you use inhalers (even only as needed), please bring them with you on the day of your procedure.   You will be contacted by [facility] in the next 2 days to arrange a CT abdomen and Pelvis .  The number on your caller ID will be [phone], please answer when they call.  If you have not heard from them in 2 days please call [phone] to schedule.     You have been scheduled for a CT scan of the abdomen and pelvis at [facility], 1st floor Radiology. You are scheduled on     at    . You should arrive 15 minutes prior to your appointment time for registration.  :   1) Do not eat anything after  (4 hours prior to your test)   You may take any medications as prescribed with a small amount of water, if necessary. If you take any of the following medications: METFORMIN, GLUCOPHAGE, GLUCOVANCE, AVANDAMET, RIOMET, FORTAMET, [place] MET, JANUMET, GLUMETZA or METAGLIP, you MAY be asked to HOLD this medication 48 hours AFTER the exam.   The purpose of you drinking the oral contrast is to aid in the visualization of your intestinal tract. The contrast solution may cause some diarrhea. Depending on your individual set of symptoms, you may also receive an intravenous injection of x-ray contrast/dye. Plan on being at [facility] for 45  minutes or longer, depending on the type of exam you are having performed.   If you have any questions regarding your exam or if you need to reschedule, you may call [facility] at [phone] between the hours of 8:00 am and 5:00 pm, Monday-Friday.    The [facility] providers would like to encourage you to use [facility] to communicate with providers for non-urgent requests or questions.  Due to long hold times on the telephone, sending your provider a message by [facility] may be a faster and more efficient way to get a response.  Please allow 48 business hours for a response.  Please remember that this is for non-urgent requests.   It was a pleasure to see you today!  Thank you for trusting me with your gastrointestinal care!    Scott E.Candis Schatz, MD

## 2023-01-06 ENCOUNTER — Encounter (HOSPITAL_COMMUNITY)
Admission: RE | Admit: 2023-01-06 | Discharge: 2023-01-06 | Disposition: A | Discharge status: Home / Self Care | Payer: BC Managed Care – PPO | Source: Ambulatory Visit | Attending: Family Medicine | Admitting: Family Medicine

## 2023-01-06 DIAGNOSIS — D649 Anemia, unspecified: Secondary | ICD-10-CM | POA: Diagnosis present

## 2023-01-06 LAB — TISSUE TRANSGLUTAMINASE, IGA: (tTG) Ab, IgA: <1 U/mL

## 2023-01-06 LAB — IGA: Immunoglobulin A: 314 mg/dL — ABNORMAL HIGH (ref 47–310)

## 2023-01-06 LAB — PREPARE RBC (CROSSMATCH)

## 2023-01-06 MED ORDER — SODIUM CHLORIDE 0.9% IV SOLUTION: Freq: Once | INTRAVENOUS | Status: DC

## 2023-01-07 ENCOUNTER — Ambulatory Visit (AMBULATORY_SURGERY_CENTER): Payer: BC Managed Care – PPO | Admitting: Gastroenterology

## 2023-01-07 ENCOUNTER — Encounter: Payer: Self-pay | Admitting: Gastroenterology

## 2023-01-07 VITALS — BP 96/60 | HR 68 | Temp 97.8°F | Resp 10 | Ht 62.0 in | Wt 183.0 lb

## 2023-01-07 DIAGNOSIS — D509 Iron deficiency anemia, unspecified: Secondary | ICD-10-CM

## 2023-01-07 DIAGNOSIS — K317 Polyp of stomach and duodenum: Secondary | ICD-10-CM

## 2023-01-07 DIAGNOSIS — D500 Iron deficiency anemia secondary to blood loss (chronic): Secondary | ICD-10-CM | POA: Diagnosis not present

## 2023-01-07 DIAGNOSIS — K2950 Unspecified chronic gastritis without bleeding: Secondary | ICD-10-CM

## 2023-01-07 DIAGNOSIS — R634 Abnormal weight loss: Secondary | ICD-10-CM

## 2023-01-07 LAB — TYPE AND SCREEN
ABO/RH(D): A POS
Antibody Screen: NEGATIVE
Unit division: 0
Unit division: 0

## 2023-01-07 LAB — BPAM RBC
Blood Product Expiration Date: 202403012359
Blood Product Expiration Date: 202403012359
ISSUE DATE / TIME: 202402010935
ISSUE DATE / TIME: 202402011224
Unit Type and Rh: 6200
Unit Type and Rh: 6200

## 2023-01-07 MED ORDER — SODIUM CHLORIDE 0.9 % IV SOLN: 500.0000 mL | Freq: Once | INTRAVENOUS | Status: AC

## 2023-01-07 NOTE — Patient Instructions (Addendum)
Await pathology results.  Repeat colonoscopy at age 32 for screening purposes.  Proceed with CT as planned.  Resume previous diet and medications.    YOU HAD AN ENDOSCOPIC PROCEDURE TODAY AT [facility]:   Refer to the procedure report that was given to you for any specific questions about what was found during the examination.  If the procedure report does not answer your questions, please call your gastroenterologist to clarify.  If you requested that your care partner not be given the details of your procedure findings, then the procedure report has been included in a sealed envelope for you to review at your convenience later.  YOU SHOULD EXPECT: Some feelings of bloating in the abdomen. Passage of more gas than usual.  Walking can help get rid of the air that was put into your GI tract during the procedure and reduce the bloating. If you had a lower endoscopy (such as a colonoscopy or flexible sigmoidoscopy) you may notice spotting of blood in your stool or on the toilet paper. If you underwent a bowel prep for your procedure, you may not have a normal bowel movement for a few days.  Please Note:  You might notice some irritation and congestion in your nose or some drainage.  This is from the oxygen used during your procedure.  There is no need for concern and it should clear up in a day or so.  SYMPTOMS TO REPORT IMMEDIATELY:  Following lower endoscopy (colonoscopy or flexible sigmoidoscopy):  Excessive amounts of blood in the stool  Significant tenderness or worsening of abdominal pains  Swelling of the abdomen that is new, acute  Fever of 100F or higher  Following upper endoscopy (EGD)  Vomiting of blood or coffee ground material  New chest pain or pain under the shoulder blades  Painful or persistently difficult swallowing  New shortness of breath  Fever of 100F or higher  Black, tarry-looking stools  For urgent or emergent issues, a gastroenterologist can  be reached at any hour by calling [phone]. Do not use MyChart messaging for urgent concerns.    DIET:  We do recommend a small meal at first, but then you may proceed to your regular diet.  Drink plenty of fluids but you should avoid alcoholic beverages for 24 hours.  MEDICATIONS: Continue present medications.  Please see handouts given to you by your recovery nurse.  Thank you for allowing [place] to provide for your healthcare needs today.  ACTIVITY:  You should plan to take it easy for the rest of today and you should NOT DRIVE or use heavy machinery until tomorrow (because of the sedation medicines used during the test).    FOLLOW UP: Our staff will call the number listed on your records the next business day following your procedure.  We will call around 7:15- 8:00 am to check on you and address any questions or concerns that you may have regarding the information given to you following your procedure. If we do not reach you, we will leave a message.     If any biopsies were taken you will be contacted by phone or by letter within the next 1-3 weeks.  Please call us at [phone] if you have not heard about the biopsies in 3 weeks.    SIGNATURES/CONFIDENTIALITY: You and/or your care partner have signed paperwork which will be entered into your electronic medical record.  These signatures attest to the fact that that the information above on your  After Visit Summary has been reviewed and is understood.  Full responsibility of the confidentiality of this discharge information lies with you and/or your care-partner.

## 2023-01-07 NOTE — Progress Notes (Signed)
History and Physical Interval Note:  01/07/2023 11:55 AM  Ariel Turner  has presented today for endoscopic procedure(s), with the diagnosis of  Encounter Diagnoses  Name Primary?   Iron deficiency anemia, unspecified iron deficiency anemia type Yes   Unintentional weight loss   .  The various methods of evaluation and treatment have been discussed with the patient and/or family. After consideration of risks, benefits and other options for treatment, the patient has consented to  the endoscopic procedure(s).   The patient's history has been reviewed, patient examined, no change in status, stable for endoscopic procedure(s).  I have reviewed the patient's chart and labs.  Questions were answered to the patient's satisfaction.     Jaquelin Meaney E. Candis Schatz, MD [facility]

## 2023-01-07 NOTE — Progress Notes (Signed)
Sedate, gd SR, tolerated procedure well, VSS, report to RN 

## 2023-01-07 NOTE — Progress Notes (Signed)
Pt's states no medical or surgical changes since previsit or office visit. VS assessed by N.[place] ?

## 2023-01-07 NOTE — Progress Notes (Signed)
Called to room to assist during endoscopic procedure.  Patient ID and intended procedure confirmed with present staff. Received instructions for my participation in the procedure from the performing physician.  

## 2023-01-07 NOTE — Op Note (Signed)
[facility] Patient Name: Ariel Turner Procedure Date: 01/07/2023 11:56 AM MRN: [id number] Endoscopist: Nicki Reaper E. Candis Schatz , MD, [id number] Age: 32 Referring MD:  Date of Birth: 1991-09-05 Gender: Female Account #: [id number] Procedure:                Colonoscopy Indications:              Iron deficiency anemia secondary to chronic blood                            loss, Weight loss Medicines:                Monitored Anesthesia Care Procedure:                Pre-Anesthesia Assessment:                           - Prior to the procedure, a History and Physical                            was performed, and patient medications and                            allergies were reviewed. The patient's tolerance of                            previous anesthesia was also reviewed. The risks                            and benefits of the procedure and the sedation                            options and risks were discussed with the patient.                            All questions were answered, and informed consent                            was obtained. Prior Anticoagulants: The patient has                            taken no anticoagulant or antiplatelet agents. ASA                            Grade Assessment: II - A patient with mild systemic                            disease. After reviewing the risks and benefits,                            the patient was deemed in satisfactory condition to                            undergo the procedure.  After obtaining informed consent, the colonoscope                            was passed under direct vision. Throughout the                            procedure, the patient's blood pressure, pulse, and                            oxygen saturations were monitored continuously. The                            Olympus PCF-[id number] (#[id number]) Colonoscope was                            introduced through the anus and  advanced to the the                            terminal ileum, with identification of the                            appendiceal orifice and IC valve. The colonoscopy                            was performed without difficulty. The patient                            tolerated the procedure well. The quality of the                            bowel preparation was good. The terminal ileum,                            ileocecal valve, appendiceal orifice, and rectum                            were photographed. The bowel preparation used was                            SUPREP via split dose instruction. Scope In: 12:17:35 PM Scope Out: 12:30:25 PM Scope Withdrawal Time: 0 hours 8 minutes 44 seconds  Total Procedure Duration: 0 hours 12 minutes 50 seconds  Findings:                 The perianal and digital rectal examinations were                            normal. Pertinent negatives include normal                            sphincter tone and no palpable rectal lesions.                           The colon (entire examined portion) appeared normal.  The terminal ileum appeared normal.                           The retroflexed view of the distal rectum and anal                            verge was normal and showed no anal or rectal                            abnormalities. Complications:            No immediate complications. Estimated Blood Loss:     Estimated blood loss: none. Impression:               - The entire examined colon is normal.                           - The examined portion of the ileum was normal.                           - The distal rectum and anal verge are normal on                            retroflexion view.                           - No specimens collected.                           - No endoscopic abnormalities to explain iron                            deficiency anemia or unintentional weight loss Recommendation:           - Patient has  a contact number available for                            emergencies. The signs and symptoms of potential                            delayed complications were discussed with the                            patient. Return to normal activities tomorrow.                            Written discharge instructions were provided to the                            patient.                           - Resume previous diet.                           - Continue present medications.                           -  Repeat colonoscopy at age 39 for screening                            purposes (father with reported 27 polyps on index                            screening colonoscopy).                           - Proceed with CT scan as planned Othello Dickenson E. Candis Schatz, MD 01/07/2023 12:52:08 PM This report has been signed electronically.

## 2023-01-07 NOTE — Op Note (Signed)
[place] Patient Name: Ariel [facility] Procedure Date: 01/07/2023 11:57 AM MRN: [id number] Endoscopist: Nicki Reaper E. Candis Schatz , MD, [id number] Age: 32 Referring MD:  Date of Birth: 1991-04-08 Gender: Female Account #: [id number] Procedure:                Upper GI endoscopy Indications:              Iron deficiency anemia secondary to chronic blood                            loss, Weight loss Medicines:                Monitored Anesthesia Care Procedure:                Pre-Anesthesia Assessment:                           - Prior to the procedure, a History and Physical                            was performed, and patient medications and                            allergies were reviewed. The patient's tolerance of                            previous anesthesia was also reviewed. The risks                            and benefits of the procedure and the sedation                            options and risks were discussed with the patient.                            All questions were answered, and informed consent                            was obtained. Prior Anticoagulants: The patient has                            taken no anticoagulant or antiplatelet agents. ASA                            Grade Assessment: II - A patient with mild systemic                            disease. After reviewing the risks and benefits,                            the patient was deemed in satisfactory condition to                            undergo the procedure.  After obtaining informed consent, the endoscope was                            passed under direct vision. Throughout the                            procedure, the patient's blood pressure, pulse, and                            oxygen saturations were monitored continuously. The                            Olympus Scope [id number] was introduced through the                            mouth, and advanced to  the third part of duodenum.                            The upper GI endoscopy was accomplished without                            difficulty. The patient tolerated the procedure                            well. Scope In: Scope Out: Findings:                 The examined portions of the nasopharynx,                            oropharynx and larynx were normal.                           Localized mucosal changes characterized by                            hyperemia were found at the gastroesophageal                            junction.                           The exam of the esophagus was otherwise normal.                           Diffuse atrophic mucosa was found in the entire                            examined stomach. Biopsies were taken with a cold                            forceps for Helicobacter pylori testing. Estimated                            blood loss was minimal.  Two 2 to 4 mm sessile polyps were found in the                            gastric body. The polyp was removed with a cold                            biopsy forceps. Resection and retrieval were                            complete. Estimated blood loss was minimal.                           The exam of the stomach was otherwise normal.                           The examined duodenum was normal. Biopsies for                            histology were taken with a cold forceps for                            evaluation of celiac disease. Estimated blood loss                            was minimal. Complications:            No immediate complications. Estimated Blood Loss:     Estimated blood loss was minimal. Estimated blood                            loss was minimal. Impression:               - The examined portions of the nasopharynx,                            oropharynx and larynx were normal.                           - Hyperemia mucosa in the esophagus.                           -  Gastric mucosal atrophy. Biopsied.                           - Two gastric polyps. Resected and retrieved.                           - Normal examined duodenum. Biopsied.                           - No endoscopic abnormalities to explain iron                            deficiency anemia or unintentional weight loss. Recommendation:           - Patient has a  contact number available for                            emergencies. The signs and symptoms of potential                            delayed complications were discussed with the                            patient. Return to normal activities tomorrow.                            Written discharge instructions were provided to the                            patient.                           - Resume previous diet.                           - Continue present medications.                           - Await pathology results.                           - Proceed with colonoscopy Reinaldo Helt E. Candis Schatz, MD 01/07/2023 12:43:26 PM This report has been signed electronically.

## 2023-01-10 ENCOUNTER — Telehealth: Payer: Self-pay

## 2023-01-10 NOTE — Progress Notes (Signed)
Ariel Turner,  Your test for celiac disease was negative (normal).  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  This very mild elevation is not uncommon and is not associated with any particular disease process.  No further evaluation is recommended for this.  Please complete the CT scan as discussed for further work up of your unintentional weight loss.

## 2023-01-10 NOTE — Telephone Encounter (Signed)
  Follow up Call-     01/07/2023   10:56 AM  Call back number  Post procedure Call Back phone  # [phone]  Permission to leave phone message Yes    Post op call attempted, no answer, left WM.

## 2023-01-13 ENCOUNTER — Other Ambulatory Visit: Payer: BC Managed Care – PPO

## 2023-01-13 ENCOUNTER — Ambulatory Visit (INDEPENDENT_AMBULATORY_CARE_PROVIDER_SITE_OTHER): Payer: BC Managed Care – PPO | Admitting: Student

## 2023-01-13 VITALS — BP 118/80 | HR 65 | Wt 182.0 lb

## 2023-01-13 DIAGNOSIS — N86 Erosion and ectropion of cervix uteri: Secondary | ICD-10-CM

## 2023-01-13 DIAGNOSIS — D649 Anemia, unspecified: Secondary | ICD-10-CM

## 2023-01-13 NOTE — Progress Notes (Signed)
    SUBJECTIVE:   CHIEF COMPLAINT / HPI:   Patient is a 32 year old female presenting today for cervical exam.  Patient noted at her last visit last months for a pap smear there were some notable changes on her cervix and was recommended to follow up with colpo clinic for further evaluation of the cervix.  Her prior Pap smears have been normal and LMP was 12/20/2022.  She denies any vaginal discharge, odor or irritation.  PERTINENT  PMH / PSH: Reviewed   OBJECTIVE:   BP 118/80   Pulse 65   Wt 182 lb (82.6 kg)   LMP 12/20/2022   SpO2 98%   BMI 33.29 kg/m     Physical Exam General: Alert, well appearing, NAD Genitalia:  Normal introitus for age, no external lesions, copious vaginal secretions  without any discharges or notable odor, mucosa pink and moist, no vaginal atrophy, no friaility or hemorrhage, normal uterus size and position. Notable diffused erythematous lesion  around the cervical oz  RN Ok Edwards served as chaperone for this exam.  Dr. Gwendlyn Deutscher was the supervising faculty and Dr. Sabra Heck was present for the entire encounter.  ASSESSMENT/PLAN:   Cervical Ectropion  Patient cervical exam was unremarkable other than diffused erythematous lesions consistent with cervical ectropion. She has had normal pap smears prior and no report of vaginal discharge, odor or irritation. No further evaluation indicated at this time. Reassured patients these finding are benign are related to increased estrogen before start of period.    Alen Bleacher, MD [place]

## 2023-01-13 NOTE — Patient Instructions (Addendum)
It was wonderful to meet you today. Thank you for allowing me to be a part of your care. Below is a short summary of what we discussed at your visit today:  Your cervical exam today was normal today. No sign of abnormality.   Your exam was notable for Cervical ectropion which is a benign cervical change usually in response to estrogen and can be present just before the start of your period.   Please bring all of your medications to every appointment!  If you have any questions or concerns, please do not hesitate to contact us via phone or MyChart message.   Alen Bleacher, MD [facility]   Cervical Eversion You will find out what this condition is, and that treatment is generally not needed. To view the content, go to this web address: [email or web address]  This video will expire on: 08/10/2024. If you need access to this video following this date, please reach out to the healthcare provider who assigned it to you. This information is not intended to replace advice given to you by your health care provider. Make sure you discuss any questions you have with your health care provider. [employer] Patient Education  [place].

## 2023-01-19 NOTE — Progress Notes (Signed)
[place],  The biopsies taken from your stomach were notable for mild chronic gastritis (inflammation) which is a common finding, but there was no evidence of Helicobacter pylori infection. This common finding is not likely to explain iron deficiency or weight loss and there is no specific treatment or further evaluation recommended. The polyps resected from your stomach were benign fundic gland polyps.  There was no evidence of infection with Helicobacter pylori or intestinal metaplasia/dysplasia.  These types of polyps are typically secondary to acid suppression therapy and no specific follow-up required for these small, benign polyps. The biopsies of your duodenum were unremarkable with no evidence of celiac disease. Please complete the CT scan as previously discussed.

## 2023-01-25 ENCOUNTER — Ambulatory Visit (HOSPITAL_COMMUNITY): Payer: BC Managed Care – PPO

## 2023-01-26 ENCOUNTER — Ambulatory Visit (HOSPITAL_COMMUNITY)
Admission: RE | Admit: 2023-01-26 | Discharge: 2023-01-26 | Disposition: A | Discharge status: Home / Self Care | Payer: BC Managed Care – PPO | Source: Ambulatory Visit | Attending: Gastroenterology | Admitting: Gastroenterology

## 2023-01-26 DIAGNOSIS — R634 Abnormal weight loss: Secondary | ICD-10-CM

## 2023-01-26 DIAGNOSIS — D509 Iron deficiency anemia, unspecified: Secondary | ICD-10-CM | POA: Insufficient documentation

## 2023-01-26 MED ORDER — IOHEXOL 300 MG/ML  SOLN
100.0000 mL | Freq: Once | INTRAMUSCULAR | Status: AC | PRN
  Administered 2023-01-26: 100 mL via INTRAVENOUS

## 2023-01-28 NOTE — Progress Notes (Signed)
Ariel Turner,  Your CT scan did not show any concerning findings to explain your weight loss.  A large gallstone was noted.  This can cause episodic abdominal pain, but would not cause iron deficiency anemia or weight loss.  I doubt that this gallstone is the source of your abdominal pain and do not recommend you have gallbladder removed at this time.  Please follow up with your gynecologist to discuss further management of your heavy menstrual bleeding, which is the most likely cause of your iron deficiency anemia.
